# Patient Record
Sex: Male | Born: 1985 | Race: White | Hispanic: No | Marital: Married | Smoking: Former smoker
Health system: Southern US, Community
[De-identification: ages and names within clinical notes are randomized; demographics above are authoritative.]

## PROBLEM LIST (undated history)

## (undated) HISTORY — PX: NO PAST SURGERIES: SHX2092

---

## 2019-01-08 NOTE — Progress Notes (Signed)
Subjective:    Patient ID: [name], male    DOB: 03-Jun-1986, 33 y.o.   MRN: [id number]  HPI  He is here to establish with a new pcp.   The patient is here for an acute visit.   Last Friday and worse over the weekend.  He started getting exhausted.  He also has had tightness in the left side of his chest when walking around, going from a sitting to standing position and taking deep breathes. He denies pain with pressing on his chest.  Sitting or laying down improves the pain.    He has also noticed his HR at rest which is typically in the low 50's is higher in the 65-80 range.  He denies any palpitations, but his HR is more pronounced.      He has not exercised in the past week, but typicaly runs 2-3 times a week and has no difficulty running.    He has not seen any doctor in a long time.  He does not monitor his BP.  His mom and dad both have elevated BP.  He does not eat as good as he should.     Medications and allergies reviewed with patient and updated if appropriate.  Patient Active Problem List   Diagnosis Date Noted  . Elevated blood pressure reading 01/09/2019  . Fatigue 01/09/2019  . Chest tightness 01/09/2019    No current outpatient medications on file prior to visit.   No current facility-administered medications on file prior to visit.     History reviewed. No pertinent past medical history.  History reviewed. No pertinent surgical history.  Social History   Socioeconomic History  . Marital status: Married    Spouse name: Not on file  . Number of children: 1  . Years of education: Not on file  . Highest education level: Not on file  Occupational History  . Not on file  Social Needs  . Financial resource strain: Not on file  . Food insecurity:    Worry: Not on file    Inability: Not on file  . Transportation needs:    Medical: Not on file    Non-medical: Not on file  Tobacco Use  . Smoking status: Never Smoker  . Smokeless tobacco: Never  Used  Substance and Sexual Activity  . Alcohol use: Yes    Comment: daily - 1-2 drinks  . Drug use: Never  . Sexual activity: Yes  Lifestyle  . Physical activity:    Days per week: Not on file    Minutes per session: Not on file  . Stress: Not on file  Relationships  . Social connections:    Talks on phone: Not on file    Gets together: Not on file    Attends religious service: Not on file    Active member of club or organization: Not on file    Attends meetings of clubs or organizations: Not on file    Relationship status: Not on file  Other Topics Concern  . Not on file  Social History Narrative  . Not on file    Family History  Problem Relation Age of Onset  . Hypertension Mother   . Hyperlipidemia Father   . Hypertension Father   . Cancer Maternal Uncle   . Heart failure Maternal Grandmother     Review of Systems  Constitutional: Positive for fatigue (for the past week - better today - after good night sleep). Negative for chills and  fever.  HENT: Positive for rhinorrhea. Negative for congestion, ear pain, postnasal drip, sinus pain and sore throat.   Eyes: Negative for visual disturbance.  Respiratory: Positive for cough (occ cough - likely from allergies). Negative for shortness of breath and wheezing.   Cardiovascular: Positive for chest pain (tightness). Negative for palpitations (felt his HR  - more prominent) and leg swelling.  Gastrointestinal: Negative for abdominal pain, blood in stool, constipation, diarrhea and nausea.       No gerd  Genitourinary: Negative for dysuria, frequency and hematuria.  Musculoskeletal: Negative for arthralgias and back pain.  Neurological: Positive for dizziness (maybe from fatigued). Negative for light-headedness and headaches.  Psychiatric/Behavioral: Negative for dysphoric mood. The patient is not nervous/anxious.        Objective:   Vitals:   01/09/19 0945  BP: (!) 148/94  Pulse: (!) 57  Resp: 16  Temp: 98.8 F  (37.1 C)  SpO2: 99%   BP Readings from Last 3 Encounters:  01/09/19 (!) 148/94   Wt Readings from Last 3 Encounters:  01/09/19 269 lb (122 kg)   Body mass index is 42.13 kg/m.   Physical Exam    Constitutional: He appears well-developed and well-nourished. No distress.  HENT:  Head: Normocephalic and atraumatic.  Right Ear: External ear normal.  Left Ear: External ear normal.  Mouth/Throat: Oropharynx is clear and moist.  Normal ear canals and TM b/l  Eyes: Conjunctivae and EOM are normal.  Neck: Neck supple. No tracheal deviation present. No thyromegaly present. No carotid bruit  Cardiovascular: Normal rate, regular rhythm, normal heart sounds and intact distal pulses.  No murmur heard. Pulmonary/Chest: No chest pain with palpation.  Effort normal and breath sounds normal. No respiratory distress. He has no wheezes. He has no rales.  Abdominal: Soft. He exhibits no distension. There is no tenderness.  Musculoskeletal: He exhibits no edema.  Lymphadenopathy:   He has no cervical adenopathy.  Skin: Skin is warm and dry. He is slightly diaphoretic - likely related to being nervous.  Psychiatric: He has a normal mood and affect. His behavior is normal.       Assessment & Plan:    See Problem List for Assessment and Plan of chronic medical problems.

## 2019-01-09 ENCOUNTER — Encounter: Payer: Self-pay | Admitting: Internal Medicine

## 2019-01-09 ENCOUNTER — Ambulatory Visit: Payer: Managed Care, Other (non HMO) | Admitting: Internal Medicine

## 2019-01-09 ENCOUNTER — Other Ambulatory Visit: Payer: Self-pay

## 2019-01-09 ENCOUNTER — Other Ambulatory Visit (INDEPENDENT_AMBULATORY_CARE_PROVIDER_SITE_OTHER): Payer: Managed Care, Other (non HMO)

## 2019-01-09 ENCOUNTER — Encounter (INDEPENDENT_AMBULATORY_CARE_PROVIDER_SITE_OTHER): Payer: Self-pay

## 2019-01-09 VITALS — BP 148/94 | HR 57 | Temp 98.8°F | Resp 16 | Ht 67.0 in | Wt 269.0 lb

## 2019-01-09 DIAGNOSIS — R030 Elevated blood-pressure reading, without diagnosis of hypertension: Secondary | ICD-10-CM | POA: Diagnosis not present

## 2019-01-09 DIAGNOSIS — R0789 Other chest pain: Secondary | ICD-10-CM

## 2019-01-09 DIAGNOSIS — Z23 Encounter for immunization: Secondary | ICD-10-CM | POA: Diagnosis not present

## 2019-01-09 DIAGNOSIS — R5383 Other fatigue: Secondary | ICD-10-CM | POA: Diagnosis not present

## 2019-01-09 LAB — LIPID PANEL
CHOL/HDL RATIO: 5
Cholesterol: 246 mg/dL — ABNORMAL HIGH (ref 0–200)
HDL: 46.4 mg/dL (ref >39.00)
NonHDL: 199.67
Triglycerides: 203 mg/dL — ABNORMAL HIGH (ref 0.0–149.0)
VLDL: 40.6 mg/dL — AB (ref 0.0–40.0)

## 2019-01-09 LAB — CBC WITH DIFFERENTIAL/PLATELET
Basophils Absolute: 0.1 10*3/uL (ref 0.0–0.1)
Basophils Relative: 1.2 % (ref 0.0–3.0)
Eosinophils Absolute: 0.4 10*3/uL (ref 0.0–0.7)
Eosinophils Relative: 4.3 % (ref 0.0–5.0)
HEMATOCRIT: 47.5 % (ref 39.0–52.0)
Hemoglobin: 16.4 g/dL (ref 13.0–17.0)
LYMPHS PCT: 38.1 % (ref 12.0–46.0)
Lymphs Abs: 3.5 10*3/uL (ref 0.7–4.0)
MCHC: 34.5 g/dL (ref 30.0–36.0)
MCV: 87.9 fl (ref 78.0–100.0)
Monocytes Absolute: 0.7 10*3/uL (ref 0.1–1.0)
Monocytes Relative: 7.7 % (ref 3.0–12.0)
Neutro Abs: 4.5 10*3/uL (ref 1.4–7.7)
Neutrophils Relative %: 48.7 % (ref 43.0–77.0)
Platelets: 252 10*3/uL (ref 150.0–400.0)
RBC: 5.4 Mil/uL (ref 4.22–5.81)
RDW: 13.4 % (ref 11.5–15.5)
WBC: 9.2 10*3/uL (ref 4.0–10.5)

## 2019-01-09 LAB — COMPREHENSIVE METABOLIC PANEL
ALT: 55 U/L — ABNORMAL HIGH (ref 0–53)
AST: 25 U/L (ref 0–37)
Albumin: 4.6 g/dL (ref 3.5–5.2)
Alkaline Phosphatase: 61 U/L (ref 39–117)
BUN: 17 mg/dL (ref 6–23)
CO2: 28 meq/L (ref 19–32)
Calcium: 9.9 mg/dL (ref 8.4–10.5)
Chloride: 102 mEq/L (ref 96–112)
Creatinine, Ser: 1.03 mg/dL (ref 0.40–1.50)
GFR: 83.61 mL/min (ref >60.00)
Glucose, Bld: 99 mg/dL (ref 70–99)
Potassium: 4.4 mEq/L (ref 3.5–5.1)
Sodium: 139 mEq/L (ref 135–145)
Total Bilirubin: 0.6 mg/dL (ref 0.2–1.2)
Total Protein: 8 g/dL (ref 6.0–8.3)

## 2019-01-09 LAB — LDL CHOLESTEROL, DIRECT: Direct LDL: 153 mg/dL

## 2019-01-09 LAB — TSH: TSH: 0.98 u[IU]/mL (ref 0.35–4.50)

## 2019-01-09 NOTE — Assessment & Plan Note (Signed)
For one week Likely related to poor sleep - improved today after getting good sleep last night Will check labs cbc, cmp,tsh Work on good sleep Follow up if not improving

## 2019-01-09 NOTE — Assessment & Plan Note (Addendum)
Left sided chest tightness that sound more MSK in nature - with walking, changing position and deep breaths, not reproducible.  Pain may also be anxiety related, but seem more msk related Will check labs - cbc, tsh, cmp, lipid Discussed ekg and cxr with him and we both agree to hold off for now, given msk in nature and he is low risk BP elevated - may be related to some anxiety, but has strong family history - his wife will start monitoring at home  If chest tightness persists/worsens he will let me know so we can evaluate further

## 2019-01-09 NOTE — Patient Instructions (Addendum)
Monitor your BP at home.  It should be less than 130/80 ideally, definitely less than 140/90.  Continue regular exercise and work on weight loss.    Tests ordered today. Your results will be released to MyChart (or called to you) after review, usually within 72hours after test completion. If any changes need to be made, you will be notified at that same time.  Flu immunization administered today.    Medications reviewed and updated.  Changes include :   none

## 2019-01-09 NOTE — Assessment & Plan Note (Signed)
BP elevated here - ? Related to being nervous or truly high Both parents have htn Start monitoring BP at home - discussed goal Work on weight loss Low sodium diet Regular exercise F/u if BP is elevated Check labs - cmp, cbc, lipid, tsh

## 2019-01-10 ENCOUNTER — Encounter: Payer: Self-pay | Admitting: Internal Medicine

## 2019-01-10 DIAGNOSIS — E785 Hyperlipidemia, unspecified: Secondary | ICD-10-CM | POA: Insufficient documentation

## 2020-07-22 DIAGNOSIS — L0591 Pilonidal cyst without abscess: Secondary | ICD-10-CM

## 2020-07-22 HISTORY — DX: Pilonidal cyst without abscess: L05.91

## 2020-09-21 DIAGNOSIS — U071 COVID-19: Secondary | ICD-10-CM

## 2020-09-21 HISTORY — DX: COVID-19: U07.1

## 2020-11-30 NOTE — Progress Notes (Signed)
Subjective:    CC: Sacral pain  I, Molly Weber, LAT, ATC, am serving as scribe for Dr. Clementeen Graham.  HPI: Pt is a 35 y/o male presenting w/ c/o sacral/LBP since Oct 2021 w/ no known MOI.  He noticed it after driving to [facility] and was in the car for about 9 hours.  He rates his pain at a 5-6/10.  He states that his pain is more nagging in nature.  He locates his pain to his midline proximal sacrum.  No injury history.  Pain is worse with the upright sitting position and better with a reclining sitting position.  No bowel or bladder dysfunction.  No numbness in the penis or anus.  Radiating pain: No Aggravating factors: sitting in an upright position for greater than 30-45 min; transitioning from sitting-to-standing Treatments tried: different types of cushions for his sitting surface;   Pertinent review of Systems: No fevers or chills nausea vomiting or diarrhea.  Relevant historical information: Obesity, hyperlipidemia, fatigue.   Objective:    Vitals:   12/01/20 1042  BP: 140/84  Pulse: 100  SpO2: 98%  Body mass index is 43.73 kg/m.  General: Well Developed, well nourished, and in no acute distress.   MSK: L-spine normal-appearing Nontender midline.  Nontender paraspinal musculature. Normal lumbar motion. Lower extremity strength intact bilaterally. Pulses capillary fill and sensation are intact distally. Reflexes are intact distal bilateral extremities.  Lab and Radiology Results  X-ray images lumbar spine and sacrum obtained today personally and independently interpreted  Lumbar spine: Probable spondylolisthesis at L5-S1 grade 1.  Sacrum and coccyx: No fractures or malalignment.  No significant abnormality sacrum or coccyx.  Await formal radiology review   Impression and Recommendations:    Assessment and Plan: 35 y.o. male with pain at sacrum with upright seated position.  This is ongoing now for months and worsening.  X-ray today is concerning to my read  for spondylolisthesis at L5-S1.  Functionally patient has what sounds like S3 radicular pain with a lumbar extension.  He is a good candidate for physical therapy referral placed.  Lengthy discussion about the importance of attending and participating in physical therapy.  Check back in about 6 weeks.  BMI 43.  Elad also mentioned that his weight may be a factor.  I agree.  Discussed weight management strategies.  Recommend calorie strict diet about 2000 cal/day with emphasis on protein about 100 g of protein per day.  Recommend: Healthy weight and wellness as a referral option.  PDMP not reviewed this encounter. Orders Placed This Encounter  Procedures  . DG Lumbar Spine 2-3 Views    Standing Status:   Future    Number of Occurrences:   1    Standing Expiration Date:   12/01/2021    Order Specific Question:   Reason for Exam (SYMPTOM  OR DIAGNOSIS REQUIRED)    Answer:   eval lower back pain    Order Specific Question:   Preferred imaging location?    Answer:   [facility]  . DG Sacrum/Coccyx    Standing Status:   Future    Number of Occurrences:   1    Standing Expiration Date:   12/01/2021    Order Specific Question:   Reason for Exam (SYMPTOM  OR DIAGNOSIS REQUIRED)    Answer:   eval sacrum pain    Order Specific Question:   Preferred imaging location?    Answer:   [facility]  . Ambulatory referral  to Physical Therapy    Referral Priority:   Routine    Referral Type:   Physical Medicine    Referral Reason:   Specialty Services Required    Requested Specialty:   Physical Therapy    Number of Visits Requested:   1   No orders of the defined types were placed in this encounter.   Discussed warning signs or symptoms. Please see discharge instructions. Patient expresses understanding.   The above documentation has been reviewed and is accurate and complete Clementeen Graham, M.D.

## 2020-12-01 ENCOUNTER — Encounter: Payer: Self-pay | Admitting: Family Medicine

## 2020-12-01 ENCOUNTER — Other Ambulatory Visit: Payer: Self-pay

## 2020-12-01 ENCOUNTER — Ambulatory Visit: Payer: Managed Care, Other (non HMO) | Admitting: Family Medicine

## 2020-12-01 ENCOUNTER — Ambulatory Visit (INDEPENDENT_AMBULATORY_CARE_PROVIDER_SITE_OTHER): Payer: Managed Care, Other (non HMO)

## 2020-12-01 VITALS — BP 140/84 | HR 100 | Ht 67.0 in | Wt 279.2 lb

## 2020-12-01 DIAGNOSIS — M5418 Radiculopathy, sacral and sacrococcygeal region: Secondary | ICD-10-CM

## 2020-12-01 DIAGNOSIS — G8929 Other chronic pain: Secondary | ICD-10-CM

## 2020-12-01 DIAGNOSIS — Z6841 Body Mass Index (BMI) 40.0 and over, adult: Secondary | ICD-10-CM

## 2020-12-01 DIAGNOSIS — M545 Low back pain, unspecified: Secondary | ICD-10-CM

## 2020-12-01 NOTE — Patient Instructions (Addendum)
Thank you for coming in today.  Please get an Xray today before you leave  Please perform the exercise program that we have prepared for you and gone over in detail on a daily basis.  In addition to the handout you were provided you can access your program through: [email or web address]   Your unique program code is:  [email or web address]   I've referred you to Physical Therapy.  Let us know if you don't hear from them in one week.  Recheck in 6 weeks.   Let me know if this is not working.   Sign up for mychart.   Let me know if you want a referral to [facility]. I think its helpful.   TLDNR version is 2000 calories a day 100g protein a day eat breakfast and lunch.

## 2020-12-02 NOTE — Progress Notes (Signed)
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  This is that condition I was telling about where the vertebrae can shift.  The good news is physical therapy is very effective for this condition.  If not better MRI will be next step.  I believe we found the explanation for pain based on the x-ray.

## 2020-12-02 NOTE — Progress Notes (Signed)
X-ray sacrum and coccyx looks normal

## 2021-01-02 ENCOUNTER — Encounter: Payer: Self-pay | Admitting: Family Medicine

## 2021-01-12 ENCOUNTER — Ambulatory Visit: Payer: Managed Care, Other (non HMO) | Admitting: Family Medicine

## 2021-01-12 DIAGNOSIS — M4306 Spondylolysis, lumbar region: Secondary | ICD-10-CM | POA: Insufficient documentation

## 2021-01-12 NOTE — Patient Instructions (Addendum)
You may have a pilonidal cyst.  This may be causing your pain.  A referral was ordered for surgery.     Blood work was ordered.     Medications changes include :   none   Please followup in 1 year     Pilonidal Cyst  A pilonidal cyst is a fluid-filled sac that forms beneath the skin near the tailbone, at the top of the crease of the buttocks (pilonidal area). If the cyst is not large and not infected, it may not cause any problems. If the cyst becomes irritated or infected, it may get larger and fill with pus. An infected cyst is called an abscess. A pilonidal abscess may cause pain and swelling, and it may need to be drained or removed. What are the causes? The cause of this condition is not always known. In some cases, a hair that grows into your skin (ingrown hair) may be the cause. What increases the risk? You are more likely to get a pilonidal cyst if you:  Are male.  Have lots of hair near the crease of the buttocks.  Are overweight.  Have a dimple near the crease of the buttocks.  Wear tight clothing.  Do not bathe or shower often.  Sit for long periods of time. What are the signs or symptoms? Signs and symptoms of a pilonidal cyst may include pain, swelling, redness, and warmth in the pilonidal area. Depending on how big the cyst is, you may be able to feel a lump near your tailbone. If your cyst becomes infected, symptoms may include:  Pus or fluid drainage.  Fever.  Pain, swelling, and redness getting worse.  The lump getting bigger. How is this diagnosed? This condition may be diagnosed based on:  Your symptoms and medical history.  A physical exam.  A blood test to check for infection.  Testing a pus sample, if applicable. How is this treated? If your cyst does not cause symptoms, you may not need any treatment. If your cyst bothers you or is infected, you may need a procedure to drain or remove the cyst. Depending on the size, location, and  severity of your cyst, your health care provider may:  Make an incision in the cyst and drain it (incision and drainage).  Open and drain the cyst, and then stitch the wound so that it stays open while it heals (marsupialization). You will be given instructions about how to care for your open wound while it heals.  Remove all or part of the cyst, and then close the wound (cyst removal). You may need to take antibiotic medicines before your procedure. Follow these instructions at home: Medicines  Take over-the-counter and prescription medicines only as told by your health care provider.  If you were prescribed an antibiotic medicine, take it as told by your health care provider. Do not stop taking the antibiotic even if you start to feel better. General instructions  Keep the area around your pilonidal cyst clean and dry.  If there is fluid or pus draining from your cyst: ? Cover the area with a clean bandage (dressing) as needed. ? Wash the area gently with soap and water. Pat the area dry with a clean towel. Do not rub the area because that may cause bleeding.  Remove hair from the area around the cyst only if your health care provider tells you to do this.  Do not wear tight pants or sit in one position for long periods at  a time.  Keep all follow-up visits as told by your health care provider. This is important. Contact a health care provider if you have:  New redness, swelling, or pain.  A pilonidal cyst is a fluid-filled sac that forms beneath the skin near the tailbone, at the top of the crease of the buttocks (pilonidal area).  If the cyst becomes irritated or infected, it may get larger and fill with pus. An infected cyst is called an abscess.  The cause of this condition is not always known. In some cases, a hair that grows into your skin (ingrown hair) may be the cause.  If your cyst does not cause symptoms, you may not need any treatment. If your cyst bothers you or is  infected, you may need a procedure to drain or remove the cyst. This information is not intended to replace advice given to you by your health care provider. Make sure you discuss any questions you have with your health care provider. Document Revised: 09/26/2017 Document Reviewed: 09/26/2017 Elsevier Patient Education  2021 [employer]     Health Maintenance, Male Adopting a healthy lifestyle and getting preventive care are important in promoting health and wellness. Ask your health care provider about:  The right schedule for you to have regular tests and exams.  Things you can do on your own to prevent diseases and keep yourself healthy. What should I know about diet, weight, and exercise? Eat a healthy diet  Eat a diet that includes plenty of vegetables, fruits, low-fat dairy products, and lean protein.  Do not eat a lot of foods that are high in solid fats, added sugars, or sodium.   Maintain a healthy weight Body mass index (BMI) is a measurement that can be used to identify possible weight problems. It estimates body fat based on height and weight. Your health care provider can help determine your BMI and help you achieve or maintain a healthy weight. Get regular exercise Get regular exercise. This is one of the most important things you can do for your health. Most adults should:  Exercise for at least 150 minutes each week. The exercise should increase your heart rate and make you sweat (moderate-intensity exercise).  Do strengthening exercises at least twice a week. This is in addition to the moderate-intensity exercise.  Spend less time sitting. Even light physical activity can be beneficial. Watch cholesterol and blood lipids Have your blood tested for lipids and cholesterol at 34 years of age, then have this test every 5 years. You may need to have your cholesterol levels checked more often if:  Your lipid or cholesterol levels are high.  You are older than 34  years of age.  You are at high risk for heart disease. What should I know about cancer screening? Many types of cancers can be detected early and may often be prevented. Depending on your health history and family history, you may need to have cancer screening at various ages. This may include screening for:  Colorectal cancer.  Prostate cancer.  Skin cancer.  Lung cancer. What should I know about heart disease, diabetes, and high blood pressure? Blood pressure and heart disease  High blood pressure causes heart disease and increases the risk of stroke. This is more likely to develop in people who have high blood pressure readings, are of African descent, or are overweight.  Talk with your health care provider about your target blood pressure readings.  Have your blood pressure checked: ? Every 3-5  years if you are 64-35 years of age. ? Every year if you are 2 years old or older.  If you are between the ages of 58 and 57 and are a current or former smoker, ask your health care provider if you should have a one-time screening for abdominal aortic aneurysm (AAA). Diabetes Have regular diabetes screenings. This checks your fasting blood sugar level. Have the screening done:  Once every three years after age 40 if you are at a normal weight and have a low risk for diabetes.  More often and at a younger age if you are overweight or have a high risk for diabetes. What should I know about preventing infection? Hepatitis B If you have a higher risk for hepatitis B, you should be screened for this virus. Talk with your health care provider to find out if you are at risk for hepatitis B infection. Hepatitis C Blood testing is recommended for:  Everyone born from [birth year] through 1964.  Anyone with known risk factors for hepatitis C. Sexually transmitted infections (STIs)  You should be screened each year for STIs, including gonorrhea and chlamydia, if: ? You are sexually active and are  younger than 34 years of age. ? You are older than 34 years of age and your health care provider tells you that you are at risk for this type of infection. ? Your sexual activity has changed since you were last screened, and you are at increased risk for chlamydia or gonorrhea. Ask your health care provider if you are at risk.  Ask your health care provider about whether you are at high risk for HIV. Your health care provider may recommend a prescription medicine to help prevent HIV infection. If you choose to take medicine to prevent HIV, you should first get tested for HIV. You should then be tested every 3 months for as long as you are taking the medicine. Follow these instructions at home: Lifestyle  Do not use any products that contain nicotine or tobacco, such as cigarettes, e-cigarettes, and chewing tobacco. If you need help quitting, ask your health care provider.  Do not use street drugs.  Do not share needles.  Ask your health care provider for help if you need support or information about quitting drugs. Alcohol use  Do not drink alcohol if your health care provider tells you not to drink.  If you drink alcohol: ? Limit how much you have to 0-2 drinks a day. ? Be aware of how much alcohol is in your drink. In the [place] one drink equals one 12 oz bottle of beer (355 mL), one 5 oz glass of wine (148 mL), or one 1 oz glass of hard liquor (44 mL). General instructions  Schedule regular health, dental, and eye exams.  Stay current with your vaccines.  Tell your health care provider if: ? You often feel depressed. ? You have ever been abused or do not feel safe at home. Summary  Adopting a healthy lifestyle and getting preventive care are important in promoting health and wellness.  Follow your health care provider's instructions about healthy diet, exercising, and getting tested or screened for diseases.  Follow your health care provider's instructions on monitoring your  cholesterol and blood pressure. This information is not intended to replace advice given to you by your health care provider. Make sure you discuss any questions you have with your health care provider. Document Revised: 10/01/2018 Document Reviewed: 10/01/2018 Elsevier Patient Education  2021 [employer].

## 2021-01-12 NOTE — Progress Notes (Signed)
Subjective:    Patient ID: [name], male    DOB: Nov 07, 1985, 35 y.o.   MRN: [id number]  HPI He is here for a physical exam.   Pars defect - sees Dr Denyse Amass.  Has a standing desk. He is doing PT.  Per his wife - he has been very short, irritable, snippy and not listing / retaining important conversations they have.  He knows he has ben different.  This has been going on for a little over one month.  He works at home so work/home is too integrated.  He had a bad review at work for something that was no his fault - he has been upset by this.    When he was having lower back pain - he was freaking out that he might have had cancer.  He did have an incidental finding on his imaging and that freaked him out. His uncle died from prostate cancer and he got that in his head.   He does not like being in or around large crowds or many people.  This includes grocery store, any store, hiking/zoo, science center, restaurant, driving and just being out in general.   He is not sleeping well, but states that is improving.   Cut/cracking of his skin on his upper buttock.  It bleeds and is very sore.  It started over one month ago.  He has tried vaseline, baby powder, showering and cleaning the are.  He has tried an antifungal cream and burrows solution.     Medications and allergies reviewed with patient and updated if appropriate.  Patient Active Problem List   Diagnosis Date Noted  . 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 BMI 40.0-44.9, adult (HCC) 12/01/2020  . Hyperlipidemia 01/10/2019  . Fatigue 01/09/2019  . Chest tightness 01/09/2019    No current outpatient medications on file prior to visit.   No current facility-administered medications on file prior to visit.    History reviewed. No pertinent past medical history.  History reviewed. No pertinent surgical history.  Social History   Socioeconomic History  . Marital status: Married    Spouse name: Not on file  . Number  of children: 1  . Years of education: Not on file  . Highest education level: Not on file  Occupational History  . Not on file  Tobacco Use  . Smoking status: Never Smoker  . Smokeless tobacco: Never Used  Substance and Sexual Activity  . Alcohol use: Yes    Comment: daily - 1-2 drinks  . Drug use: Never  . Sexual activity: Yes  Other Topics Concern  . Not on file  Social History Narrative  . Not on file   Social Determinants of Health   Financial Resource Strain: Not on file  Food Insecurity: Not on file  Transportation Needs: Not on file  Physical Activity: Not on file  Stress: Not on file  Social Connections: Not on file    Family History  Problem Relation Age of Onset  . Hypertension Mother   . Hyperlipidemia Father   . Hypertension Father   . Cancer Maternal Uncle        ? pancreatic cancer  . Heart failure Maternal Grandmother     Review of Systems  Constitutional: Negative for chills and fever.  Eyes: Negative for visual disturbance.  Respiratory: Negative for cough, shortness of breath and wheezing.   Cardiovascular: Negative for chest pain, palpitations and leg swelling.  Gastrointestinal: Negative for abdominal  pain, blood in stool, constipation, diarrhea and nausea.       No gerd  Genitourinary: Negative for difficulty urinating and dysuria.  Musculoskeletal: Positive for arthralgias (knee and ankles - mild) and back pain.  Skin: Positive for rash (lower back). Negative for color change.  Neurological: Negative for dizziness, light-headedness, numbness and headaches.  Psychiatric/Behavioral: Positive for dysphoric mood and sleep disturbance. The patient is nervous/anxious.        Objective:   Vitals:   01/13/21 1045  BP: 130/78  Pulse: 98  Temp: 98.3 F (36.8 C)  SpO2: 98%   Filed Weights   01/13/21 1045  Weight: 271 lb (122.9 kg)   Body mass index is 42.44 kg/m.  BP Readings from Last 3 Encounters:  01/13/21 130/78  12/01/20 140/84   01/09/19 (!) 148/94    Wt Readings from Last 3 Encounters:  01/13/21 271 lb (122.9 kg)  12/01/20 279 lb 3.2 oz (126.6 kg)  01/09/19 269 lb (122 kg)   Depression screen PHQ 2/9 01/13/2021  Decreased Interest 1  Down, Depressed, Hopeless 1  PHQ - 2 Score 2  Altered sleeping 2  Tired, decreased energy 1  Change in appetite 1  Feeling bad or failure about yourself  0  Trouble concentrating 0  Moving slowly or fidgety/restless 0  Suicidal thoughts 0  PHQ-9 Score 6  Difficult doing work/chores Somewhat difficult    GAD 7 : Generalized Anxiety Score 01/13/2021  Nervous, Anxious, on Edge 1  Control/stop worrying 1  Worry too much - different things 0  Trouble relaxing 0  Restless 0  Easily annoyed or irritable 1  Afraid - awful might happen 0  Total GAD 7 Score 3  Anxiety Difficulty Not difficult at all      Physical Exam Constitutional: He appears well-developed and well-nourished. No distress.  HENT:  Head: Normocephalic and atraumatic.  Right Ear: External ear normal.  Left Ear: External ear normal.  Mouth/Throat: Oropharynx is clear and moist.  Normal ear canals and TM b/l  Eyes: Conjunctivae and EOM are normal.  Neck: Neck supple. No tracheal deviation present. No thyromegaly present.  No carotid bruit  Cardiovascular: Normal rate, regular rhythm, normal heart sounds and intact distal pulses.   No murmur heard. Pulmonary/Chest: Effort normal and breath sounds normal. No respiratory distress. He has no wheezes. He has no rales.  Abdominal: Soft. He exhibits no distension. There is no tenderness.  Genitourinary: deferred  Musculoskeletal: He exhibits no edema.  Lymphadenopathy:   He has no cervical adenopathy.  Skin: Skin is warm and dry. He is not diaphoretic. Pilonidal cyst with slight blood at entry  - no active bleeding, no d/c.  No surrounding erythema Psychiatric: He has a normal mood and affect. His behavior is normal.         Assessment & Plan:    Physical exam: Screening blood work  ordered Immunizations  Declined flu, tdap 2014.   Exercise   Started running again Weight  Advised weight loss Substance abuse   none   Screening for depression and anxiety do indicate mild anxiety and depression.  He feels his symptoms are improving.  He declined any medication at this time, but I did advise him that if he wanted to try something temporarily he can let me know  See Problem List for Assessment and Plan of chronic medical problems.   This visit occurred during the SARS-CoV-2 public health emergency.  Safety protocols were in place, including screening questions prior to  the visit, additional usage of staff PPE, and extensive cleaning of exam room while observing appropriate contact time as indicated for disinfecting solutions.

## 2021-01-13 ENCOUNTER — Encounter: Payer: Self-pay | Admitting: Internal Medicine

## 2021-01-13 ENCOUNTER — Ambulatory Visit (INDEPENDENT_AMBULATORY_CARE_PROVIDER_SITE_OTHER): Payer: Managed Care, Other (non HMO) | Admitting: Internal Medicine

## 2021-01-13 ENCOUNTER — Other Ambulatory Visit: Payer: Self-pay

## 2021-01-13 VITALS — BP 130/78 | HR 98 | Temp 98.3°F | Ht 67.0 in | Wt 271.0 lb

## 2021-01-13 DIAGNOSIS — Z0000 Encounter for general adult medical examination without abnormal findings: Secondary | ICD-10-CM

## 2021-01-13 DIAGNOSIS — E782 Mixed hyperlipidemia: Secondary | ICD-10-CM | POA: Diagnosis not present

## 2021-01-13 DIAGNOSIS — L0591 Pilonidal cyst without abscess: Secondary | ICD-10-CM | POA: Diagnosis not present

## 2021-01-13 DIAGNOSIS — M4306 Spondylolysis, lumbar region: Secondary | ICD-10-CM

## 2021-01-13 DIAGNOSIS — Z6841 Body Mass Index (BMI) 40.0 and over, adult: Secondary | ICD-10-CM | POA: Diagnosis not present

## 2021-01-13 DIAGNOSIS — Z1159 Encounter for screening for other viral diseases: Secondary | ICD-10-CM

## 2021-01-13 DIAGNOSIS — R739 Hyperglycemia, unspecified: Secondary | ICD-10-CM | POA: Diagnosis not present

## 2021-01-13 LAB — CBC WITH DIFFERENTIAL/PLATELET
Basophils Absolute: 0 10*3/uL (ref 0.0–0.1)
Basophils Relative: 0.6 % (ref 0.0–3.0)
Eosinophils Absolute: 0.2 10*3/uL (ref 0.0–0.7)
Eosinophils Relative: 2.7 % (ref 0.0–5.0)
HCT: 45.7 % (ref 39.0–52.0)
Hemoglobin: 15.4 g/dL (ref 13.0–17.0)
Lymphocytes Relative: 29.2 % (ref 12.0–46.0)
Lymphs Abs: 2.3 10*3/uL (ref 0.7–4.0)
MCHC: 33.6 g/dL (ref 30.0–36.0)
MCV: 88.4 fl (ref 78.0–100.0)
Monocytes Absolute: 0.5 10*3/uL (ref 0.1–1.0)
Monocytes Relative: 6.4 % (ref 3.0–12.0)
Neutro Abs: 4.8 10*3/uL (ref 1.4–7.7)
Neutrophils Relative %: 61.1 % (ref 43.0–77.0)
Platelets: 239 10*3/uL (ref 150.0–400.0)
RBC: 5.17 Mil/uL (ref 4.22–5.81)
RDW: 13.6 % (ref 11.5–15.5)
WBC: 7.8 10*3/uL (ref 4.0–10.5)

## 2021-01-13 LAB — COMPREHENSIVE METABOLIC PANEL
ALT: 51 U/L (ref 0–53)
AST: 27 U/L (ref 0–37)
Albumin: 4.8 g/dL (ref 3.5–5.2)
Alkaline Phosphatase: 49 U/L (ref 39–117)
BUN: 20 mg/dL (ref 6–23)
CO2: 28 mEq/L (ref 19–32)
Calcium: 10.2 mg/dL (ref 8.4–10.5)
Chloride: 101 mEq/L (ref 96–112)
Creatinine, Ser: 1.07 mg/dL (ref 0.40–1.50)
GFR: 90.76 mL/min (ref >60.00)
Glucose, Bld: 101 mg/dL — ABNORMAL HIGH (ref 70–99)
Potassium: 3.9 mEq/L (ref 3.5–5.1)
Sodium: 139 mEq/L (ref 135–145)
Total Bilirubin: 0.8 mg/dL (ref 0.2–1.2)
Total Protein: 8 g/dL (ref 6.0–8.3)

## 2021-01-13 LAB — LIPID PANEL
Cholesterol: 209 mg/dL — ABNORMAL HIGH (ref 0–200)
HDL: 41.4 mg/dL (ref >39.00)
LDL Cholesterol: 133 mg/dL — ABNORMAL HIGH (ref 0–99)
NonHDL: 167.4
Total CHOL/HDL Ratio: 5
Triglycerides: 172 mg/dL — ABNORMAL HIGH (ref 0.0–149.0)
VLDL: 34.4 mg/dL (ref 0.0–40.0)

## 2021-01-13 LAB — HEMOGLOBIN A1C: Hgb A1c MFr Bld: 5.4 % (ref 4.6–6.5)

## 2021-01-13 LAB — TSH: TSH: 0.99 u[IU]/mL (ref 0.35–4.50)

## 2021-01-13 NOTE — Assessment & Plan Note (Signed)
Chronic He is now exercising regularly Encouraged weight loss

## 2021-01-13 NOTE — Assessment & Plan Note (Signed)
Sugars have been on the high side A1c

## 2021-01-13 NOTE — Assessment & Plan Note (Signed)
Chronic Lipid panel Continue regular exercise Encouraged weight loss

## 2021-01-13 NOTE — Assessment & Plan Note (Signed)
Acute Looks like he has a pilonidal cyst-no evidence of infection He does have pain with sitting and this could be the cause We will refer to surgery for further confirmation and treatment options

## 2021-01-16 LAB — HEPATITIS C ANTIBODY
Hepatitis C Ab: NONREACTIVE
SIGNAL TO CUT-OFF: 0.01 (ref <1.00)

## 2021-02-14 ENCOUNTER — Ambulatory Visit: Payer: Managed Care, Other (non HMO) | Admitting: Family Medicine

## 2021-02-21 ENCOUNTER — Ambulatory Visit: Payer: Self-pay | Admitting: Surgery

## 2021-02-21 NOTE — H&P (Signed)
History of Present Illness ([name] L. Demetri Kerman MD; 02/21/2021 10:50 AM) The patient is a 35 year old male who presents with a pilonidal cyst.HPI: [name] is a 35 yo male who was referred for evaluation of a pilonidal cyst. In October 2021 he started having pain and discomfort in the sacral area. He has also noticed some drainage intermittently. The pain gets worse with sitting and he is not able to site for longer than about 45 minutes. He has never had an abscess or needed an I&D of the area. Prior to October, he was not having any symptoms.  PMH: none  PSH: none  FHx: HTN (both parents)  Social: Never smoker    Past Surgical History Arbutus Ped, CMA; 02/21/2021 10:31 AM) No pertinent past surgical history   Diagnostic Studies History Arbutus Ped, CMA; 02/21/2021 10:31 AM) Colonoscopy  never  Allergies Arbutus Ped, CMA; 02/21/2021 10:32 AM) No Known Drug Allergies  [02/21/2021]: Allergies Reconciled   Medication History Arbutus Ped, CMA; 02/21/2021 10:32 AM) No Current Medications Medications Reconciled  Social History Arbutus Ped, CMA; 02/21/2021 10:31 AM) Alcohol use  Occasional alcohol use. Caffeine use  Carbonated beverages, Coffee. No drug use  Tobacco use  Former smoker.  Family History Arbutus Ped, CMA; 02/21/2021 10:31 AM) Arthritis  Father, Mother. Breast Cancer  Family Members In General. Cancer  Family Members In General. Cerebrovascular Accident  Family Members In General. Colon Cancer  Family Members In General. Diabetes Mellitus  Family Members In General. Heart Disease  Family Members In General. Heart disease in male family member before age 66  Heart disease in male family member before age 10  Hypertension  Father. Prostate Cancer  Family Members In General. Respiratory Condition  Father. Thyroid problems  Family Members In General, Mother.  Other Problems Arbutus Ped, CMA; 02/21/2021 10:31 AM) Anxiety Disorder  Back Pain   Hypercholesterolemia     Review of Systems Arbutus Ped CMA; 02/21/2021 10:31 AM) General Not Present- Appetite Loss, Chills, Fatigue, Fever, Night Sweats, Weight Gain and Weight Loss. Skin Present- New Lesions. Not Present- Change in Wart/Mole, Dryness, Hives, Jaundice, Non-Healing Wounds, Rash and Ulcer. HEENT Not Present- Earache, Hearing Loss, Hoarseness, Nose Bleed, Oral Ulcers, Ringing in the Ears, Seasonal Allergies, Sinus Pain, Sore Throat, Visual Disturbances, Wears glasses/contact lenses and Yellow Eyes. Respiratory Present- Snoring. Not Present- Bloody sputum, Chronic Cough, Difficulty Breathing and Wheezing. Breast Not Present- Breast Mass, Breast Pain, Nipple Discharge and Skin Changes. Cardiovascular Not Present- Chest Pain, Difficulty Breathing Lying Down, Leg Cramps, Palpitations, Rapid Heart Rate, Shortness of Breath and Swelling of Extremities. Gastrointestinal Not Present- Abdominal Pain, Bloating, Bloody Stool, Change in Bowel Habits, Chronic diarrhea, Constipation, Difficulty Swallowing, Excessive gas, Gets full quickly at meals, Hemorrhoids, Indigestion, Nausea, Rectal Pain and Vomiting. Male Genitourinary Not Present- Blood in Urine, Change in Urinary Stream, Frequency, Impotence, Nocturia, Painful Urination, Urgency and Urine Leakage. Musculoskeletal Present- Back Pain. Not Present- Joint Pain, Joint Stiffness, Muscle Pain, Muscle Weakness and Swelling of Extremities. Neurological Not Present- Decreased Memory, Fainting, Headaches, Numbness, Seizures, Tingling, Tremor, Trouble walking and Weakness. Psychiatric Not Present- Anxiety, Bipolar, Change in Sleep Pattern, Depression, Fearful and Frequent crying. Endocrine Not Present- Cold Intolerance, Excessive Hunger, Hair Changes, Heat Intolerance, Hot flashes and New Diabetes. Hematology Not Present- Blood Thinners, Easy Bruising, Excessive bleeding, Gland problems, HIV and Persistent Infections.  Vitals Arbutus Ped CMA;  02/21/2021 10:32 AM) 02/21/2021 10:32 AM Weight: 262.5 lb Height: 67in Body Surface Area: 2.27 m Body Mass Index: 41.11 kg/m  Temp.: 97.59F  Pulse: 127 (Regular)  P.OX: 98% (Room air) BP: 134/62(Sitting, Left Arm, Standard)       Physical Exam (Ciclaly Mulcahey L. Freida Busman MD; 02/21/2021 10:51 AM) The physical exam findings are as follows: Note: Constitutional: No acute distress; conversant; no deformities Neuro: alert and oriented; cranial nerves grossly in tact; no focal deficits Eyes: Moist conjunctiva; anicteric sclerae; extraocular movements in tact Neck: Trachea midline Lungs: Normal respiratory effort; lungs clear to auscultation bilaterally; symmetric chest wall expansion CV: Regular rate and rhythm; no murmurs; no pitting edema Anorectal: open wound in the superior gluteal cleft at midline, approximately 1cm in length with granulation tissue at the base and mild serous drainage. Several small pits noted inferior to this. MSK: Normal gait and station; no clubbing/cyanosis Psychiatric: Appropriate affect; alert and oriented 3    Assessment & Plan (Rayanna Matusik L. Jajaira Ruis MD; 02/21/2021 10:53 AM) PILONIDAL CYST (L05.91) Story: 35 yo male presenting with sacral pain and drainage, and evidence of a pilonidal sinus on physical exam. We discussed treatment options, including nonoperative, vs excision. 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 I discussed his activities will be restricted for several weeks postoperatively while his wound heals and he expressed understanding. Will schedule outpatient elective pilonidal cyst excision. Patient will be contacted to schedule a surgery date. Education pamphlet on pilonidal disease was provided.  [name], MD [facility], Hepatobiliary and Pancreatic Surgery 02/21/21 11:18 AM

## 2021-04-07 ENCOUNTER — Telehealth: Payer: Self-pay

## 2021-04-09 NOTE — Telephone Encounter (Signed)
This should be done by the surgeon.

## 2021-04-10 ENCOUNTER — Other Ambulatory Visit: Payer: Self-pay

## 2021-04-10 ENCOUNTER — Encounter (HOSPITAL_BASED_OUTPATIENT_CLINIC_OR_DEPARTMENT_OTHER): Payer: Self-pay | Admitting: Surgery

## 2021-04-10 DIAGNOSIS — R030 Elevated blood-pressure reading, without diagnosis of hypertension: Secondary | ICD-10-CM

## 2021-04-10 HISTORY — DX: Elevated blood-pressure reading, without diagnosis of hypertension: R03.0

## 2021-04-10 NOTE — Progress Notes (Signed)
Spoke w/ via phone for pre-op interview---pt Lab needs dos---- none              Lab results------none COVID test -----patient states asymptomatic no test needed Arrive at -------700 am 04-13-2021 NPO after MN NO Solid Food.  Clear liquids from MN until--- Med rec completed Medications to take morning of surgery -----600 am then npo Diabetic medication ----- Patient instructed no nail polish to be worn day of surgery Patient instructed to bring photo id and insurance card day of surgery Patient aware to have Driver (ride ) / caregiver   wife Luanna Cole will drop pt off  for 24 hours after surgery  Patient Special Instructions -----none Pre-Op special Istructions -----none Patient verbalized understanding of instructions that were given at this phone interview. Patient denies shortness of breath, chest pain, fever, cough at this phone interview.

## 2021-04-12 ENCOUNTER — Encounter (HOSPITAL_BASED_OUTPATIENT_CLINIC_OR_DEPARTMENT_OTHER): Payer: Self-pay | Admitting: Surgery

## 2021-04-12 NOTE — Anesthesia Preprocedure Evaluation (Addendum)
Anesthesia Evaluation  Patient identified by MRN, date of birth, ID band Patient awake    Reviewed: Allergy & Precautions, NPO status , Patient's Chart, lab work & pertinent test results  Airway Mallampati: I  TM Distance: >3 FB Neck ROM: Full    Dental no notable dental hx. (+) Teeth Intact, Dental Advisory Given   Pulmonary former smoker,  Hx/o Covid 09/2020- recovered  Snores- possible undiagnosed OSA   Pulmonary exam normal breath sounds clear to auscultation       Cardiovascular negative cardio ROS Normal cardiovascular exam Rhythm:Regular Rate:Normal     Neuro/Psych negative neurological ROS  negative psych ROS   GI/Hepatic negative GI ROS, Neg liver ROS,   Endo/Other  Morbid obesityHyperlipidemia  Renal/GU negative Renal ROS  negative genitourinary   Musculoskeletal Pilonidal cyst   Abdominal (+) + obese,   Peds  Hematology negative hematology ROS (+)   Anesthesia Other Findings   Reproductive/Obstetrics                           Anesthesia Physical Anesthesia Plan  ASA: 3  Anesthesia Plan: General   Post-op Pain Management:    Induction: Intravenous  PONV Risk Score and Plan: 3 and Treatment may vary due to age or medical condition, Midazolam, Scopolamine patch - Pre-op, Dexamethasone and Ondansetron  Airway Management Planned: Oral ETT  Additional Equipment:   Intra-op Plan:   Post-operative Plan: Extubation in OR  Informed Consent: I have reviewed the patients History and Physical, chart, labs and discussed the procedure including the risks, benefits and alternatives for the proposed anesthesia with the patient or authorized representative who has indicated his/her understanding and acceptance.     Dental advisory given  Plan Discussed with: CRNA and Anesthesiologist  Anesthesia Plan Comments:        Anesthesia Quick Evaluation

## 2021-04-12 NOTE — Progress Notes (Signed)
Pt aware case time moved to 0730, new arrival time 0530 confirmed.

## 2021-04-13 ENCOUNTER — Ambulatory Visit (HOSPITAL_BASED_OUTPATIENT_CLINIC_OR_DEPARTMENT_OTHER): Payer: Managed Care, Other (non HMO) | Admitting: Anesthesiology

## 2021-04-13 ENCOUNTER — Ambulatory Visit (HOSPITAL_BASED_OUTPATIENT_CLINIC_OR_DEPARTMENT_OTHER)
Admission: RE | Admit: 2021-04-13 | Discharge: 2021-04-13 | Disposition: A | Discharge status: Home / Self Care | Payer: Managed Care, Other (non HMO) | Attending: Surgery | Admitting: Surgery

## 2021-04-13 ENCOUNTER — Encounter (HOSPITAL_BASED_OUTPATIENT_CLINIC_OR_DEPARTMENT_OTHER)
Admission: RE | Disposition: A | Discharge status: Home / Self Care | Payer: Self-pay | Source: Home / Self Care | Attending: Surgery

## 2021-04-13 ENCOUNTER — Other Ambulatory Visit: Payer: Self-pay

## 2021-04-13 ENCOUNTER — Telehealth: Payer: Self-pay | Admitting: Internal Medicine

## 2021-04-13 ENCOUNTER — Encounter (HOSPITAL_BASED_OUTPATIENT_CLINIC_OR_DEPARTMENT_OTHER): Payer: Self-pay | Admitting: Surgery

## 2021-04-13 DIAGNOSIS — L0591 Pilonidal cyst without abscess: Secondary | ICD-10-CM | POA: Diagnosis present

## 2021-04-13 DIAGNOSIS — Z8616 Personal history of COVID-19: Secondary | ICD-10-CM | POA: Diagnosis not present

## 2021-04-13 DIAGNOSIS — Z87891 Personal history of nicotine dependence: Secondary | ICD-10-CM | POA: Diagnosis not present

## 2021-04-13 HISTORY — PX: PILONIDAL CYST EXCISION: SHX744

## 2021-04-13 SURGERY — EXCISION, SIMPLE PILONIDAL CYST
Anesthesia: General | Site: Coccyx

## 2021-04-13 MED ORDER — SUGAMMADEX SODIUM 200 MG/2ML IV SOLN
INTRAVENOUS | Status: DC | PRN
  Administered 2021-04-13: 200 mg via INTRAVENOUS

## 2021-04-13 MED ORDER — MIDAZOLAM HCL 2 MG/2ML IJ SOLN
INTRAMUSCULAR | Status: AC
  Filled 2021-04-13: qty 2

## 2021-04-13 MED ORDER — BUPIVACAINE LIPOSOME 1.3 % IJ SUSP
INTRAMUSCULAR | Status: DC | PRN
  Administered 2021-04-13: 8.5 mL

## 2021-04-13 MED ORDER — DEXAMETHASONE SODIUM PHOSPHATE 10 MG/ML IJ SOLN
INTRAMUSCULAR | Status: AC
  Filled 2021-04-13: qty 1

## 2021-04-13 MED ORDER — ONDANSETRON HCL 4 MG/2ML IJ SOLN: 4.0000 mg | Freq: Once | INTRAMUSCULAR | Status: DC | PRN

## 2021-04-13 MED ORDER — LACTATED RINGERS IV SOLN: INTRAVENOUS | Status: DC

## 2021-04-13 MED ORDER — GLYCOPYRROLATE PF 0.2 MG/ML IJ SOSY
PREFILLED_SYRINGE | INTRAMUSCULAR | Status: DC | PRN
  Administered 2021-04-13: .2 mg via INTRAVENOUS

## 2021-04-13 MED ORDER — GABAPENTIN 300 MG PO CAPS
300.0000 mg | ORAL_CAPSULE | ORAL | Status: AC
  Administered 2021-04-13: 300 mg via ORAL

## 2021-04-13 MED ORDER — CEFAZOLIN IN SODIUM CHLORIDE 3-0.9 GM/100ML-% IV SOLN
3.0000 g | INTRAVENOUS | Status: AC
  Administered 2021-04-13: 3 g via INTRAVENOUS

## 2021-04-13 MED ORDER — ARTIFICIAL TEARS OPHTHALMIC OINT
TOPICAL_OINTMENT | OPHTHALMIC | Status: AC
  Filled 2021-04-13: qty 3.5

## 2021-04-13 MED ORDER — LIDOCAINE HCL (PF) 2 % IJ SOLN
INTRAMUSCULAR | Status: AC
  Filled 2021-04-13: qty 5

## 2021-04-13 MED ORDER — PROPOFOL 10 MG/ML IV BOLUS
INTRAVENOUS | Status: AC
  Filled 2021-04-13: qty 40

## 2021-04-13 MED ORDER — LIDOCAINE 2% (20 MG/ML) 5 ML SYRINGE
INTRAMUSCULAR | Status: DC | PRN
  Administered 2021-04-13: 100 mg via INTRAVENOUS

## 2021-04-13 MED ORDER — PHENYLEPHRINE 40 MCG/ML (10ML) SYRINGE FOR IV PUSH (FOR BLOOD PRESSURE SUPPORT)
PREFILLED_SYRINGE | INTRAVENOUS | Status: DC | PRN
Start: 2021-04-13 — End: 2021-04-13
  Administered 2021-04-13 (×2): 80 ug via INTRAVENOUS

## 2021-04-13 MED ORDER — DEXAMETHASONE SODIUM PHOSPHATE 10 MG/ML IJ SOLN
INTRAMUSCULAR | Status: DC | PRN
  Administered 2021-04-13: 10 mg via INTRAVENOUS

## 2021-04-13 MED ORDER — OXYCODONE HCL 5 MG PO TABS
5.0000 mg | ORAL_TABLET | Freq: Once | ORAL | Status: AC | PRN
  Administered 2021-04-13: 5 mg via ORAL

## 2021-04-13 MED ORDER — BUPIVACAINE-EPINEPHRINE 0.5% -1:200000 IJ SOLN
INTRAMUSCULAR | Status: DC | PRN
  Administered 2021-04-13: 21.5 mL

## 2021-04-13 MED ORDER — HYDROCODONE-ACETAMINOPHEN 5-325 MG PO TABS: 1.0000 | ORAL_TABLET | Freq: Four times a day (QID) | ORAL | 0 refills | Status: DC | PRN

## 2021-04-13 MED ORDER — GABAPENTIN 300 MG PO CAPS
ORAL_CAPSULE | ORAL | Status: AC
  Filled 2021-04-13: qty 1

## 2021-04-13 MED ORDER — PROPOFOL 10 MG/ML IV BOLUS
INTRAVENOUS | Status: DC | PRN
  Administered 2021-04-13: 200 mg via INTRAVENOUS

## 2021-04-13 MED ORDER — ONDANSETRON HCL 4 MG/2ML IJ SOLN
INTRAMUSCULAR | Status: DC | PRN
  Administered 2021-04-13: 4 mg via INTRAVENOUS

## 2021-04-13 MED ORDER — ROCURONIUM BROMIDE 10 MG/ML (PF) SYRINGE
PREFILLED_SYRINGE | INTRAVENOUS | Status: AC
  Filled 2021-04-13: qty 10

## 2021-04-13 MED ORDER — PHENYLEPHRINE 40 MCG/ML (10ML) SYRINGE FOR IV PUSH (FOR BLOOD PRESSURE SUPPORT)
PREFILLED_SYRINGE | INTRAVENOUS | Status: AC
  Filled 2021-04-13: qty 10

## 2021-04-13 MED ORDER — ROCURONIUM BROMIDE 10 MG/ML (PF) SYRINGE
PREFILLED_SYRINGE | INTRAVENOUS | Status: DC | PRN
  Administered 2021-04-13: 60 mg via INTRAVENOUS

## 2021-04-13 MED ORDER — MIDAZOLAM HCL 2 MG/2ML IJ SOLN
INTRAMUSCULAR | Status: DC | PRN
  Administered 2021-04-13: 2 mg via INTRAVENOUS

## 2021-04-13 MED ORDER — FENTANYL CITRATE (PF) 100 MCG/2ML IJ SOLN: 25.0000 ug | INTRAMUSCULAR | Status: DC | PRN

## 2021-04-13 MED ORDER — FENTANYL CITRATE (PF) 100 MCG/2ML IJ SOLN
INTRAMUSCULAR | Status: AC
  Filled 2021-04-13: qty 2

## 2021-04-13 MED ORDER — OXYCODONE HCL 5 MG/5ML PO SOLN: 5.0000 mg | Freq: Once | ORAL | Status: AC | PRN

## 2021-04-13 MED ORDER — ACETAMINOPHEN 500 MG PO TABS
ORAL_TABLET | ORAL | Status: AC
  Filled 2021-04-13: qty 1

## 2021-04-13 MED ORDER — CEFAZOLIN IN SODIUM CHLORIDE 3-0.9 GM/100ML-% IV SOLN
INTRAVENOUS | Status: AC
  Filled 2021-04-13: qty 100

## 2021-04-13 MED ORDER — ACETAMINOPHEN 500 MG PO TABS
1000.0000 mg | ORAL_TABLET | ORAL | Status: AC
  Administered 2021-04-13: 1000 mg via ORAL

## 2021-04-13 MED ORDER — ACETAMINOPHEN 500 MG PO TABS
ORAL_TABLET | ORAL | Status: AC
  Filled 2021-04-13: qty 2

## 2021-04-13 MED ORDER — ONDANSETRON HCL 4 MG/2ML IJ SOLN
INTRAMUSCULAR | Status: AC
  Filled 2021-04-13: qty 2

## 2021-04-13 MED ORDER — FENTANYL CITRATE (PF) 100 MCG/2ML IJ SOLN
INTRAMUSCULAR | Status: DC | PRN
  Administered 2021-04-13: 100 ug via INTRAVENOUS

## 2021-04-13 MED ORDER — OXYCODONE HCL 5 MG PO TABS
ORAL_TABLET | ORAL | Status: AC
  Filled 2021-04-13: qty 1

## 2021-04-13 SURGICAL SUPPLY — 45 items
APL SKNCLS STERI-STRIP NONHPOA (GAUZE/BANDAGES/DRESSINGS) ×1
BENZOIN TINCTURE PRP APPL 2/3 (GAUZE/BANDAGES/DRESSINGS) ×2 IMPLANT
BLADE EXTENDED COATED 6.5IN (ELECTRODE) IMPLANT
BLADE HEX COATED 2.75 (ELECTRODE) IMPLANT
BLADE SURG 10 STRL SS (BLADE) IMPLANT
BLADE SURG 15 STRL LF DISP TIS (BLADE) ×1 IMPLANT
BLADE SURG 15 STRL SS (BLADE) ×2
BRIEF STRETCH FOR OB PAD LRG (UNDERPADS AND DIAPERS) IMPLANT
COVER BACK TABLE 60X90IN (DRAPES) ×2 IMPLANT
COVER MAYO STAND STRL (DRAPES) ×2 IMPLANT
COVER WAND RF STERILE (DRAPES) ×2 IMPLANT
DRAIN PENROSE 0.25X18 (DRAIN) IMPLANT
DRAPE LAPAROTOMY 100X72 PEDS (DRAPES) ×2 IMPLANT
DRAPE UTILITY XL STRL (DRAPES) ×2 IMPLANT
DRSG PAD ABDOMINAL 8X10 ST (GAUZE/BANDAGES/DRESSINGS) ×2 IMPLANT
ELECT BLADE TIP CTD 4 INCH (ELECTRODE) IMPLANT
ELECT NEEDLE BLADE 2-5/6 (NEEDLE) ×2 IMPLANT
ELECT REM PT RETURN 9FT ADLT (ELECTROSURGICAL) ×2
ELECTRODE REM PT RTRN 9FT ADLT (ELECTROSURGICAL) ×1 IMPLANT
GAUZE SPONGE 4X4 12PLY STRL (GAUZE/BANDAGES/DRESSINGS) ×2 IMPLANT
GAUZE SPONGE 4X4 12PLY STRL LF (GAUZE/BANDAGES/DRESSINGS) ×2 IMPLANT
GAUZE VASELINE 3X9 (GAUZE/BANDAGES/DRESSINGS) IMPLANT
GLOVE SURG POLY MICRO LF SZ5.5 (GLOVE) ×2 IMPLANT
GLOVE SURG UNDER POLY LF SZ6 (GLOVE) ×2 IMPLANT
GOWN STRL REUS W/TWL LRG LVL3 (GOWN DISPOSABLE) ×2 IMPLANT
KIT TURNOVER CYSTO (KITS) ×2 IMPLANT
NDL SAFETY ECLIPSE 18X1.5 (NEEDLE) IMPLANT
NEEDLE HYPO 18GX1.5 SHARP (NEEDLE)
NEEDLE HYPO 25X1 1.5 SAFETY (NEEDLE) ×2 IMPLANT
NS IRRIG 500ML POUR BTL (IV SOLUTION) ×2 IMPLANT
PACK BASIN DAY SURGERY FS (CUSTOM PROCEDURE TRAY) ×2 IMPLANT
PAD ARMBOARD 7.5X6 YLW CONV (MISCELLANEOUS) IMPLANT
PENCIL SMOKE EVACUATOR (MISCELLANEOUS) ×2 IMPLANT
SPONGE SURGIFOAM ABS GEL 12-7 (HEMOSTASIS) IMPLANT
SUT CHROMIC 3 0 PS 2 (SUTURE) IMPLANT
SUT CHROMIC 3 0 SH 27 (SUTURE) ×2 IMPLANT
SUT ETHILON 3 0 PS 1 (SUTURE) ×4 IMPLANT
SUT VIC AB 2-0 SH 27 (SUTURE)
SUT VIC AB 2-0 SH 27XBRD (SUTURE) IMPLANT
SUT VIC AB 3-0 SH 18 (SUTURE) ×4 IMPLANT
SYR CONTROL 10ML LL (SYRINGE) ×2 IMPLANT
TOWEL OR 17X26 10 PK STRL BLUE (TOWEL DISPOSABLE) ×2 IMPLANT
TRAY DSU PREP LF (CUSTOM PROCEDURE TRAY) ×2 IMPLANT
TUBE CONNECTING 12X1/4 (SUCTIONS) ×2 IMPLANT
YANKAUER SUCT BULB TIP NO VENT (SUCTIONS) ×2 IMPLANT

## 2021-04-13 NOTE — Transfer of Care (Signed)
Immediate Anesthesia Transfer of Care Note  Patient: [name]  Procedure(s) Performed: Procedure(s) (LRB): PILONIDAL CYST EXCISION (N/A)  Patient Location: PACU  Anesthesia Type: General  Level of Consciousness: awake, alert  and oriented  Airway & Oxygen Therapy: Patient Spontanous Breathing and Patient connected to nasal cannula oxygen  Post-op Assessment: Report given to PACU RN and Post -op Vital signs reviewed and stable  Post vital signs: Reviewed and stable  Complications: No apparent anesthesia complications  Last Vitals:  Vitals Value Taken Time  BP 133/74 04/13/21 0843  Temp    Pulse 108 04/13/21 0845  Resp 15 04/13/21 0845  SpO2 94 % 04/13/21 0845  Vitals shown include unvalidated device data.  Last Pain:  Vitals:   04/13/21 0611  TempSrc: Oral  PainSc: 1          Complications: No notable events documented.

## 2021-04-13 NOTE — Op Note (Signed)
Date: 04/13/21  Patient: [name] MRN: [id number]  Preoperative Diagnosis: Pilonidal cyst Postoperative Diagnosis: Same  Procedure: Excision of pilonidal cyst  Surgeon: [name], MD  EBL: Minimal  Anesthesia: General  Specimens: Pilonidal cyst  Indications: [name] is a 35 yo male who presented with a pilonidal cyst with frequent pain and drainage. He elected to proceed with surgical excision.  Findings: Epithelialized sinus tract in the superior gluteal cleft, consistent with a pilonidal cyst. No signs of infection.  Procedure details: Informed consent was obtained in the preoperative area prior to the procedure. The patient was brought to the operating room and general anesthesia was induced. The patient was placed in the prone position. Perioperative antibiotics were administered per SCIP guidelines. The patient was prepped and draped in the usual sterile fashion. A pre-procedure timeout was taken verifying patient identity, surgical site and procedure to be performed.  There an epithelialized sinus tract in the superior gluteal cleft that was about 2cm in length, with several small pits noted inferior to this at the midline. An elliptical skin incision was made to include the large sinus and all visible pits. The subcutaneous tissue was then divided down to the sacral fascia using cautery. The specimen was taken off the fascia using cautery and sent for pathology. The wound was examined and there were no visible sinus tracts or cyst remnants. The wound was irrigated and hemostasis was achieved with cautery. A subcutaneous flap was raised on the right side of the incision to allow the skin to be closed just to the left of midline. The subcutaneous tissue was closed in 2 layers with interrupted 3-0 Vicryl sutures. The skin was closed with 2-0 nylon horizontal mattress sutures. A clean dressing was applied.  The patient tolerated the procedure with no apparent complications.  All counts were correct x2 at the end of the procedure. The patient was extubated and taken to PACU in stable condition.  [name], MD 04/13/21 8:48 AM

## 2021-04-13 NOTE — Telephone Encounter (Signed)
Type of form received: FMLA  Additional comments:   Received by: [place]  Form should be Faxed to: [phone]  Form should be mailed to:  N/A  Is patient requesting call for pickup: N/A   Form placed in the Provider's box.  *Attach charge sheet.  Provider will determine charge.*  Was patient informed of  7-10 business day turn around (Y/N)? N

## 2021-04-13 NOTE — Anesthesia Procedure Notes (Signed)
Procedure Name: Intubation Date/Time: 04/13/2021 7:40 AM Performed by: Mechele Claude, CRNA Pre-anesthesia Checklist: Patient identified, Emergency Drugs available, Suction available and Patient being monitored Patient Re-evaluated:Patient Re-evaluated prior to induction Oxygen Delivery Method: Circle system utilized Preoxygenation: Pre-oxygenation with 100% oxygen Induction Type: IV induction Ventilation: Mask ventilation without difficulty Laryngoscope Size: Mac and 4 Grade View: Grade II Tube type: Oral Tube size: 7.5 mm Number of attempts: 1 Airway Equipment and Method: Stylet and Oral airway Placement Confirmation: ETT inserted through vocal cords under direct vision, positive ETCO2 and breath sounds checked- equal and bilateral Secured at: 21 cm Tube secured with: Tape Dental Injury: Teeth and Oropharynx as per pre-operative assessment

## 2021-04-13 NOTE — H&P (Signed)
[name] is an 35 y.o. male.   Chief Complaint: pilonidal cyst HPI: [name] is a 35 yo male who was referred for evaluation of a pilonidal cyst. In October 2021 he started having pain and discomfort in the sacral area. He has also noticed some drainage intermittently. The pain gets worse with sitting and he is not able to site for longer than about 45 minutes. He has never had an abscess or needed an I&D of the area. Prior to October, he was not having any symptoms.  Past Medical History:  Diagnosis Date   COVID 09/2020   sob cough fatigue loss of taste/wmell x 3 weeks all symptoms resolved   Elevated blood pressure reading 04/10/2021   Pilonidal cyst 07/2020   drains occ with bleeding uses soap and water    Past Surgical History:  Procedure Laterality Date   NO PAST SURGERIES      Family History  Problem Relation Age of Onset   Hypertension Mother    Hyperlipidemia Father    Hypertension Father    Cancer Maternal Uncle        ? pancreatic cancer   Heart failure Maternal Grandmother    Social History:  reports that he quit smoking about 10 years ago. His smoking use included cigarettes. He has a 1.00 pack-year smoking history. He has never used smokeless tobacco. He reports current alcohol use. He reports that he does not use drugs.  Allergies: No Known Allergies  No medications prior to admission.    No results found for this or any previous visit (from the past 48 hour(s)). No results found.  Review of Systems  Constitutional:  Negative for chills and fever.  Eyes:  Negative for visual disturbance.  Respiratory:  Negative for wheezing and stridor.   Cardiovascular:  Negative for chest pain.  Gastrointestinal:  Negative for abdominal pain.  Neurological:  Negative for facial asymmetry and speech difficulty.  Psychiatric/Behavioral:  Negative for agitation and confusion.    Blood pressure (!) 142/90, pulse 77, temperature 98.7 F (37.1 C), temperature source  Oral, resp. rate 12, height 5\' 7"  (1.702 m), weight 117.7 kg, SpO2 98 %. Physical Exam Constitutional:      General: He is not in acute distress.    Appearance: Normal appearance.  HENT:     Head: Normocephalic and atraumatic.  Eyes:     General: No scleral icterus.    Extraocular Movements: Extraocular movements intact.  Pulmonary:     Effort: Pulmonary effort is normal. No respiratory distress.  Musculoskeletal:        General: No deformity. Normal range of motion.     Cervical back: Normal range of motion.  Skin:    General: Skin is warm and dry.     Coloration: Skin is not jaundiced.  Neurological:     General: No focal deficit present.     Mental Status: He is alert and oriented to person, place, and time.  Psychiatric:        Mood and Affect: Mood normal.        Behavior: Behavior normal.        Thought Content: Thought content normal.     Assessment/Plan 35 yo male with pilonidal cyst with significant symptoms. Proceed to OR for excision. Plan for discharge home from PACU. Postop activity restrictions discussed. Informed consent obtained.  20, MD 04/13/2021, 7:26 AM

## 2021-04-13 NOTE — Discharge Instructions (Addendum)
[facility] DISCHARGE INSTRUCTIONS  Activity You may sit, but do not bend over at the waist more than 90 degrees - this is to prevent unnecessary tension on your incision. Ok to shower in 24 hours after surgery, but do not bathe or submerge incisions underwater. Do not drive while taking narcotic pain medication.  Wound Care There are sutures in your skin - these will be removed when you come to clinic. You may shower and allow warm soapy water to run over your incision. Gently pat dry. Do not submerge your incision underwater. It is important to keep your incision as clean and dry as possible. Monitor your incision for any new redness, tenderness, or drainage. A small amount of clear or reddish drainage is normal.  When to Call us: Fever greater than 100.5 New redness, drainage, or swelling at incision site Severe pain, nausea, or vomiting   Follow-up You have an appointment scheduled with Dr. Freida Busman on April 28, 2021 at 2:00pm. This will be at the [facility] Surgery office at [address], [address], [place], [place]. Please arrive at least 15 minutes prior to your scheduled appointment time.  For questions or concerns, please call the office at [phone].    Post Anesthesia Home Care Instructions  Activity: Get plenty of rest for the remainder of the day. A responsible individual must stay with you for 24 hours following the procedure.  For the next 24 hours, DO NOT: -Drive a car -Advertising copywriter -Drink alcoholic beverages -Take any medication unless instructed by your physician -Make any legal decisions or sign important papers.  Meals: Start with liquid foods such as gelatin or soup. Progress to regular foods as tolerated. Avoid greasy, spicy, heavy foods. If nausea and/or vomiting occur, drink only clear liquids until the nausea and/or vomiting subsides. Call your physician if vomiting continues.  Special Instructions/Symptoms: Your throat  may feel dry or sore from the anesthesia or the breathing tube placed in your throat during surgery. If this causes discomfort, gargle with warm salt water. The discomfort should disappear within 24 hours.  If you had a scopolamine patch placed behind your ear for the management of post- operative nausea and/or vomiting:  1. The medication in the patch is effective for 72 hours, after which it should be removed.  Wrap patch in a tissue and discard in the trash. Wash hands thoroughly with soap and water. 2. You may remove the patch earlier than 72 hours if you experience unpleasant side effects which may include dry mouth, dizziness or visual disturbances. 3. Avoid touching the patch. Wash your hands with soap and water after contact with the patch.

## 2021-04-13 NOTE — Anesthesia Postprocedure Evaluation (Signed)
Anesthesia Post Note  Patient: [name]  Procedure(s) Performed: PILONIDAL CYST EXCISION (Coccyx)     Patient location during evaluation: PACU Anesthesia Type: General Level of consciousness: awake and alert and oriented Pain management: pain level controlled Vital Signs Assessment: post-procedure vital signs reviewed and stable Respiratory status: spontaneous breathing, nonlabored ventilation and respiratory function stable Cardiovascular status: blood pressure returned to baseline and stable Postop Assessment: no apparent nausea or vomiting Anesthetic complications: no   No notable events documented.  Last Vitals:  Vitals:   04/13/21 0915 04/13/21 0940  BP: (!) 117/56 131/77  Pulse: 74 85  Resp: 18 16  Temp:  36.6 C  SpO2: 94% 94%    Last Pain:  Vitals:   04/13/21 0940  TempSrc:   PainSc: 4                  Tavi Hoogendoorn A.

## 2021-04-14 ENCOUNTER — Encounter (HOSPITAL_BASED_OUTPATIENT_CLINIC_OR_DEPARTMENT_OTHER): Payer: Self-pay | Admitting: Surgery

## 2021-04-14 LAB — SURGICAL PATHOLOGY

## 2021-06-01 NOTE — Progress Notes (Signed)
Subjective:    Patient ID: [name], male    DOB: 1986/05/06, 34 y.o.   MRN: [id number]  HPI The patient is here for an acute visit.  He had surgery for his pilonidal cyst - he is in more pain now than prior to having surgery.  He still has drainage and bleeding.  This is a constant thing.  He has tried to contact them but has not heard from the.  He is not sure what he needs to do at this point.  He does take tramadol as needed for the pain.   Bump on left forearm - noticed it a couple of months ago.  Denies trauma.  No pain.  It is slightly red.   Medications and allergies reviewed with patient and updated if appropriate.  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     No current facility-administered medications on file prior to visit.    Past Medical History:  Diagnosis Date   COVID 09/2020   sob cough fatigue loss of taste/wmell x 3 weeks all symptoms resolved   Elevated blood pressure reading 04/10/2021   Pilonidal cyst 07/2020   drains occ with bleeding uses soap and water    Past Surgical History:  Procedure Laterality Date   NO PAST SURGERIES     PILONIDAL CYST EXCISION N/A 04/13/2021   Procedure: PILONIDAL CYST EXCISION;  Surgeon: Fritzi Mandes, MD;  Location: [facility];  Service: General;  Laterality: N/A;    Social History   Socioeconomic History   Marital status: Married    Spouse name: Not on file   Number of children: 1   Years of education: Not on file   Highest education level: Not on file  Occupational History   Not on file  Tobacco Use   Smoking status: Former    Packs/day:  0.50    Years: 2.00    Pack years: 1.00    Types: Cigarettes    Quit date: 04/11/2011    Years since quitting: 10.1   Smokeless tobacco: Never  Vaping Use   Vaping Use: Never used  Substance and Sexual Activity   Alcohol use: Yes    Comment: occ   Drug use: Never   Sexual activity: Yes  Other Topics Concern   Not on file  Social History Narrative   Not on file   Social Determinants of Health   Financial Resource Strain: Not on file  Food Insecurity: Not on file  Transportation Needs: Not on file  Physical Activity: Not on file  Stress: Not on file  Social Connections: Not on file    Family History  Problem Relation Age of Onset   Hypertension Mother    Hyperlipidemia Father    Hypertension Father    Cancer Maternal Uncle        ? pancreatic cancer   Heart failure Maternal Grandmother     Review of Systems  Constitutional:  Negative for chills and fever.  Musculoskeletal:  Positive for back pain.  Skin:  Positive for color change (Left forearm) and wound (drainage with pus and blood).  Objective:   Vitals:   06/02/21 1005  BP: 130/82  Pulse: 87  Temp: 98 F (36.7 C)  SpO2: 97%   BP Readings from Last 3 Encounters:  06/02/21 130/82  04/13/21 131/77  01/13/21 130/78   Wt Readings from Last 3 Encounters:  06/02/21 264 lb (119.7 kg)  04/13/21 259 lb 6.4 oz (117.7 kg)  01/13/21 271 lb (122.9 kg)   Body mass index is 41.35 kg/m.   Physical Exam Constitutional:      General: He is not in acute distress.    Appearance: Normal appearance. He is not ill-appearing.  HENT:     Head: Normocephalic.  Skin:    General: Skin is warm and dry.     Findings: Lesion (Papule left distal posterior forearm that is nontender-looks like an infected hair follicle, but nontender so that is unlikely-benign-appearing) present.     Comments: He deferred an exam of the pilonidal cyst region-he did show me some pictures  Neurological:     Mental Status: He is alert.            Assessment & Plan:    See Problem List for Assessment and Plan of chronic medical problems.    This visit occurred during the SARS-CoV-2 public health emergency.  Safety protocols were in place, including screening questions prior to the visit, additional usage of staff PPE, and extensive cleaning of exam room while observing appropriate contact time as indicated for disinfecting solutions.

## 2021-06-02 ENCOUNTER — Encounter: Payer: Self-pay | Admitting: Internal Medicine

## 2021-06-02 ENCOUNTER — Other Ambulatory Visit: Payer: Self-pay

## 2021-06-02 ENCOUNTER — Ambulatory Visit: Payer: Managed Care, Other (non HMO) | Admitting: Internal Medicine

## 2021-06-02 DIAGNOSIS — R238 Other skin changes: Secondary | ICD-10-CM

## 2021-06-02 DIAGNOSIS — L0591 Pilonidal cyst without abscess: Secondary | ICD-10-CM | POA: Diagnosis not present

## 2021-06-02 MED ORDER — SULFAMETHOXAZOLE-TRIMETHOPRIM 800-160 MG PO TABS: 1.0000 | ORAL_TABLET | Freq: Two times a day (BID) | ORAL | 0 refills | Status: DC

## 2021-06-02 NOTE — Assessment & Plan Note (Signed)
Acute He had surgery for the pilonidal cyst, but since then has had drainage that looks like pus and bleeding.  He is also experiencing pain Concern for possible infection-we will prescribe Bactrim DS twice daily x10 days Advised that he needs to follow-up with surgery

## 2021-06-02 NOTE — Assessment & Plan Note (Signed)
Acute Left distal posterior forearm Possibly leading to hair follicle, but nontender and does not appear to be infected This is benign appearing and reassured him

## 2021-06-02 NOTE — Patient Instructions (Signed)
   Follow up with surgery    Medications changes include :   Bactrim twice daily for 10 days.     Your prescription(s) have been submitted to your pharmacy. Please take as directed and contact our office if you believe you are having problem(s) with the medication(s).

## 2021-11-20 ENCOUNTER — Other Ambulatory Visit: Payer: Self-pay

## 2021-11-20 ENCOUNTER — Encounter: Payer: BC Managed Care – PPO | Attending: Physician Assistant | Admitting: Physician Assistant

## 2021-11-20 DIAGNOSIS — L0501 Pilonidal cyst with abscess: Secondary | ICD-10-CM | POA: Diagnosis not present

## 2021-11-20 DIAGNOSIS — L98492 Non-pressure chronic ulcer of skin of other sites with fat layer exposed: Secondary | ICD-10-CM | POA: Insufficient documentation

## 2021-11-20 DIAGNOSIS — Z87891 Personal history of nicotine dependence: Secondary | ICD-10-CM | POA: Insufficient documentation

## 2021-11-20 DIAGNOSIS — L0591 Pilonidal cyst without abscess: Secondary | ICD-10-CM | POA: Diagnosis not present

## 2021-11-20 DIAGNOSIS — T8131XA Disruption of external operation (surgical) wound, not elsewhere classified, initial encounter: Secondary | ICD-10-CM | POA: Diagnosis not present

## 2021-11-20 NOTE — Progress Notes (Addendum)
[name], [name] ([id number]) Visit Report for 11/20/2021 Chief Complaint Document Details Patient Name: [name], [name] Date of Service: 11/20/2021 12:45 PM Medical Record Number: [id number] Patient Account Number: [id number] Date of Birth/Sex: 09-11-1986 (36 y.o. M) Treating RN: [name] Primary Care Provider: Billey [name] Other Clinician: Referring Provider: Billey [name] Treating Provider/Extender: [name] in Treatment: 0 Information Obtained from: Patient Chief Complaint Disruption of pilonidal cyst removal Electronic Signature(s) Signed: 11/20/2021 1:25:26 PM By: [name] Keeler PA-C Entered By: [name] on 11/20/2021 13:25:26 [place], [name] ([id number]) -------------------------------------------------------------------------------- HPI Details Patient Name: [name] Date of Service: 11/20/2021 12:45 PM Medical Record Number: [id number] Patient Account Number: [id number] Date of Birth/Sex: 09-03-1986 (36 y.o. M) Treating RN: [name] Primary Care Provider: Billey [name] Other Clinician: Referring Provider: Billey [name] Treating Provider/Extender: [name] in Treatment: 0 History of Present Illness HPI Description: 11/20/2021 upon evaluation patient presents for initial inspection here in our clinic concerning a history of having had a pilonidal cyst summer 2022. Following that time he was told that it could take up to 6 months for this to heal therefore he is waited until now which was the 78-month mark to come in to be seen here at the wound care center. Unfortunately I am just not certain that this has been healing as well from an internal perspective it is what should be and to be honest it still draining but again there is significant depth to the wound that really has not been packed appropriately up to this point. I do believe that we do need to address that today. The good news is he is otherwise fairly healthy there is no signs of  systemic infection and locally the infection seems to be pretty well controlled at this time. I do not see significant purulent drainage although I do see quite a bit of drainage in the internal aspect of this is very friable currently. Electronic Signature(s) Signed: 11/20/2021 2:16:44 PM By: [name] Keeler PA-C Entered By: [name] on 11/20/2021 14:16:44 [name], [name] Bo ([id number]) -------------------------------------------------------------------------------- Physical Exam Details Patient Name: [name] Date of Service: 11/20/2021 12:45 PM Medical Record Number: [id number] Patient Account Number: [id number] Date of Birth/Sex: November 23, 1985 (36 y.o. M) Treating RN: [name] Primary Care Provider: Billey [name] Other Clinician: Referring Provider: Billey [name] Treating Provider/Extender: [name] in Treatment: 0 Constitutional sitting or standing blood pressure is within target range for patient.. pulse regular and within target range for patient.Marland Kitchen respirations regular, non- labored and within target range for patient.Marland Kitchen temperature within target range for patient.. Well-nourished and well-hydrated in no acute distress. Eyes conjunctiva clear no eyelid edema noted. pupils equal round and reactive to light and accommodation. Ears, Nose, Mouth, and Throat no gross abnormality of ear auricles or external auditory canals. normal hearing noted during conversation. mucus membranes moist. Respiratory normal breathing without difficulty. Cardiovascular 2+ dorsalis pedis/posterior tibialis pulses. no clubbing, cyanosis, significant edema, <3 sec cap refill. Musculoskeletal normal gait and posture. no significant deformity or arthritic changes, no loss or range of motion, no clubbing. Psychiatric this patient is able to make decisions and demonstrates good insight into disease process. Alert and Oriented x 3. pleasant and cooperative. Notes Upon inspection patient's  wound bed actually showed signs of good granulation and epithelization at upon evaluation patient's wound has quite a bit of depth at 3.5 cm. I think this is going to need to be packed and probably the easiest way to pack this is good to be with the  Hydrofera Blue rope. The question is whether we can do it full size or if we need to do it half-size. In the end I think half-size probably is the right way to go. The fold seem to be a little bit too large. The patient tolerated the packing without complication and once the wound obviously gets moist and its soft so it should not cause ongoing issues here. Electronic Signature(s) Signed: 11/20/2021 2:19:23 PM By: [name] Keeler PA-C Entered By: [name] on 11/20/2021 14:19:23 [name], [name] ([id number]) -------------------------------------------------------------------------------- Physician Orders Details Patient Name: [name] Date of Service: 11/20/2021 12:45 PM Medical Record Number: [id number] Patient Account Number: [id number] Date of Birth/Sex: 08/17/1986 (36 y.o. M) Treating RN: [name] Primary Care Provider: Billey [name] Other Clinician: Referring Provider: Billey [name] Treating Provider/Extender: [name] in Treatment: 0 Verbal / Phone Orders: No Diagnosis Coding ICD-10 Coding Code Description T81.31XA Disruption of external operation (surgical) wound, not elsewhere classified, initial encounter L98.492 Non-pressure chronic ulcer of skin of other sites with fat layer exposed L05.01 Pilonidal cyst with abscess Follow-up Appointments o Return Appointment in 1 week. o Nurse Visit as needed - come in for nurse visits x 2 weekly for dressing change, see doc x 1 week Bathing/ Shower/ Hygiene o May shower; gently cleanse wound with antibacterial soap, rinse and pat dry prior to dressing wounds - shower on days coming in to have dressing change o No tub bath. Wound Treatment Wound #1 - Sacrum  Wound Laterality: Midline Primary Dressing: Hydrofera Blue Classic Foam Rope Dressing, 9x6 (mm/in) 3 x Per Week/30 Days Discharge Instructions: rope needs to be cut in half to fit wound Secondary Dressing: Gauze 3 x Per Week/30 Days Discharge Instructions: As directed: dry, small 2x2 gauze, then large 4x4 gauze over Secondary Dressing: [place], 4x4 (in/in) 3 x Per Week/30 Days Discharge Instructions: Apply over dressing to secure in place. Secured With: 39M Medipore H Soft Cloth Surgical Tape, 2x2 (in/yd) 3 x Per Week/30 Days Electronic Signature(s) Signed: 11/22/2021 1:27:23 PM By: [name] Signed: 11/24/2021 4:48:07 PM By: [name] Keeler PA-C Previous Signature: 11/20/2021 5:07:33 PM Version By: [name] Previous Signature: 11/20/2021 5:27:53 PM Version By: [name] Keeler PA-C Entered By: [name] on 11/22/2021 13:27:23 [name], [name] ([id number]) -------------------------------------------------------------------------------- Problem List Details Patient Name: [name] Date of Service: 11/20/2021 12:45 PM Medical Record Number: [id number] Patient Account Number: [id number] Date of Birth/Sex: 12-11-1985 (36 y.o. M) Treating RN: [name] Primary Care Provider: Billey [name] Other Clinician: Referring Provider: Billey [name] Treating Provider/Extender: [name] in Treatment: 0 Active Problems ICD-10 Encounter Code Description Active Date MDM Diagnosis T81.31XA Disruption of external operation (surgical) wound, not elsewhere 11/20/2021 No Yes classified, initial encounter L98.492 Non-pressure chronic ulcer of skin of other sites with fat layer exposed 11/20/2021 No Yes L05.01 Pilonidal cyst with abscess 11/20/2021 No Yes Inactive Problems Resolved Problems Electronic Signature(s) Signed: 11/20/2021 1:24:49 PM By: [name] Keeler PA-C Entered By: [name] on 11/20/2021 13:24:48 [name], [name]  ([id number]) -------------------------------------------------------------------------------- Progress Note Details Patient Name: [name] Date of Service: 11/20/2021 12:45 PM Medical Record Number: [id number] Patient Account Number: [id number] Date of Birth/Sex: 03-18-1986 (36 y.o. M) Treating RN: [name] Primary Care Provider: Billey [name] Other Clinician: Referring Provider: Billey [name] Treating Provider/Extender: [name] in Treatment: 0 Subjective Chief Complaint Information obtained from Patient Disruption of pilonidal cyst removal History of Present Illness (HPI) 11/20/2021 upon evaluation patient presents for initial inspection here in our  clinic concerning a history of having had a pilonidal cyst summer 2022. Following that time he was told that it could take up to 6 months for this to heal therefore he is waited until now which was the 42-month mark to come in to be seen here at the wound care center. Unfortunately I am just not certain that this has been healing as well from an internal perspective it is what should be and to be honest it still draining but again there is significant depth to the wound that really has not been packed appropriately up to this point. I do believe that we do need to address that today. The good news is he is otherwise fairly healthy there is no signs of systemic infection and locally the infection seems to be pretty well controlled at this time. I do not see significant purulent drainage although I do see quite a bit of drainage in the internal aspect of this is very friable currently. Patient History Information obtained from Patient. Allergies No Known Drug Allergies Social History Former smoker - ended on 10/22/2010, Marital Status - Married, Alcohol Use - Moderate, Drug Use - No History, Caffeine Use - Daily - coffee. Review of Systems (ROS) Constitutional Symptoms (General Health) Complains or has symptoms of Marked  Weight Change. Eyes Denies complaints or symptoms of Dry Eyes, Vision Changes, Glasses / Contacts. Ear/Nose/Mouth/Throat Denies complaints or symptoms of Difficult clearing ears, Sinusitis. Hematologic/Lymphatic Denies complaints or symptoms of Bleeding / Clotting Disorders, Human Immunodeficiency Virus. Respiratory Denies complaints or symptoms of Chronic or frequent coughs, Shortness of Breath. Cardiovascular Denies complaints or symptoms of Chest pain, LE edema. Gastrointestinal Denies complaints or symptoms of Frequent diarrhea, Nausea, Vomiting. Endocrine Denies complaints or symptoms of Hepatitis, Thyroid disease, Polydypsia (Excessive Thirst). Genitourinary Denies complaints or symptoms of Kidney failure/ Dialysis, Incontinence/dribbling. Immunological Denies complaints or symptoms of Hives, Itching. Integumentary (Skin) Denies complaints or symptoms of Wounds, Bleeding or bruising tendency, Breakdown, Swelling. Musculoskeletal Denies complaints or symptoms of Muscle Pain, Muscle Weakness. Neurologic Denies complaints or symptoms of Numbness/parasthesias, Focal/Weakness. Psychiatric Denies complaints or symptoms of Anxiety, Claustrophobia. Objective [name], [name] ([id number]) Constitutional sitting or standing blood pressure is within target range for patient.. pulse regular and within target range for patient.Marland Kitchen respirations regular, non- labored and within target range for patient.Marland Kitchen temperature within target range for patient.. Well-nourished and well-hydrated in no acute distress. Vitals Time Taken: 1:08 PM, Height: 67 in, Source: Stated, Weight: 255 lbs, Source: Stated, BMI: 39.9, Temperature: 98.1 F, Pulse: 91 bpm, Respiratory Rate: 18 breaths/min, Blood Pressure: 137/93 mmHg. Eyes conjunctiva clear no eyelid edema noted. pupils equal round and reactive to light and accommodation. Ears, Nose, Mouth, and Throat no gross abnormality of ear auricles or external  auditory canals. normal hearing noted during conversation. mucus membranes moist. Respiratory normal breathing without difficulty. Cardiovascular 2+ dorsalis pedis/posterior tibialis pulses. no clubbing, cyanosis, significant edema, Musculoskeletal normal gait and posture. no significant deformity or arthritic changes, no loss or range of motion, no clubbing. Psychiatric this patient is able to make decisions and demonstrates good insight into disease process. Alert and Oriented x 3. pleasant and cooperative. General Notes: Upon inspection patient's wound bed actually showed signs of good granulation and epithelization at upon evaluation patient's wound has quite a bit of depth at 3.5 cm. I think this is going to need to be packed and probably the easiest way to pack this is good to be with the Hydrofera Blue rope. The question is whether we can  do it full size or if we need to do it half-size. In the end I think half-size probably is the right way to go. The fold seem to be a little bit too large. The patient tolerated the packing without complication and once the wound obviously gets moist and its soft so it should not cause ongoing issues here. Integumentary (Hair, Skin) Wound #1 status is Open. Original cause of wound was Surgical Injury. The date acquired was: 04/13/2021. The wound is located on the Midline Sacrum. The wound measures 1cm length x 0.5cm width x 3.5cm depth; 0.393cm^2 area and 1.374cm^3 volume. There is Fat Layer (Subcutaneous Tissue) exposed. There is no tunneling or undermining noted. There is a medium amount of serosanguineous drainage noted. There is large (67-100%) red granulation within the wound bed. There is no necrotic tissue within the wound bed. Assessment Active Problems ICD-10 Disruption of external operation (surgical) wound, not elsewhere classified, initial encounter Non-pressure chronic ulcer of skin of other sites with fat layer exposed Pilonidal cyst with  abscess Plan Follow-up Appointments: Return Appointment in 1 week. Nurse Visit as needed - come in for nurse visits x 2 weekly for dressing change, see doc x 1 week Bathing/ Shower/ Hygiene: May shower; gently cleanse wound with antibacterial soap, rinse and pat dry prior to dressing wounds - shower on days coming in to have dressing change No tub bath. WOUND #1: - Sacrum Wound Laterality: Midline Primary Dressing: Hydrofera Blue Classic Foam, 4x4 (in/in) 3 x Per Week/30 Days Discharge Instructions: rope needs to be cut in half Secondary Dressing: Gauze 3 x Per Week/30 Days Discharge Instructions: As directed: dry, small 2x2 gauze, then large 4x4 gauze over Secondary Dressing: [place], 4x4 (in/in) 3 x Per Week/30 Days Discharge Instructions: Apply over dressing to secure in place. Secured With: 63M Medipore H Soft Cloth Surgical Tape, 2x2 (in/yd) 3 x Per Week/30 Days [name], [name] ([id number]) 1. Would recommend currently that we going continue with the wound care measures as before and the patient is in agreement with plan. This includes the use of the Hydrofera Blue rope which I think is doing to do well with the pack into this area. 2. We will use gauze and behind this to hold everything in place and catch any drainage. 3. We will use a Telfa island dressing to secure in place over top. We will see patient back for reevaluation in 1 week here in the clinic. If anything worsens or changes patient will contact our office for additional recommendations. Electronic Signature(s) Signed: 11/20/2021 2:20:38 PM By: [name] Keeler PA-C Entered By: [name] on 11/20/2021 14:20:37 [name], [name] ([id number]) -------------------------------------------------------------------------------- ROS/PFSH Details Patient Name: [name] Date of Service: 11/20/2021 12:45 PM Medical Record Number: [id number] Patient Account Number: [id number] Date of Birth/Sex:  Jan 30, 1986 (36 y.o. M) Treating RN: [name] Primary Care Provider: Billey [name] Other Clinician: Referring Provider: Billey [name] Treating Provider/Extender: [name] in Treatment: 0 Information Obtained From Patient Constitutional Symptoms (General Health) Complaints and Symptoms: Positive for: Marked Weight Change Eyes Complaints and Symptoms: Negative for: Dry Eyes; Vision Changes; Glasses / Contacts Ear/Nose/Mouth/Throat Complaints and Symptoms: Negative for: Difficult clearing ears; Sinusitis Hematologic/Lymphatic Complaints and Symptoms: Negative for: Bleeding / Clotting Disorders; Human Immunodeficiency Virus Respiratory Complaints and Symptoms: Negative for: Chronic or frequent coughs; Shortness of Breath Cardiovascular Complaints and Symptoms: Negative for: Chest pain; LE edema Gastrointestinal Complaints and Symptoms: Negative for: Frequent diarrhea; Nausea; Vomiting Endocrine Complaints and Symptoms: Negative for: Hepatitis;  Thyroid disease; Polydypsia (Excessive Thirst) Genitourinary Complaints and Symptoms: Negative for: Kidney failure/ Dialysis; Incontinence/dribbling Immunological Complaints and Symptoms: Negative for: Hives; Itching Integumentary (Skin) Complaints and Symptoms: Negative for: Wounds; Bleeding or bruising tendency; Breakdown; Swelling [name], [name] ([id number]) Musculoskeletal Complaints and Symptoms: Negative for: Muscle Pain; Muscle Weakness Neurologic Complaints and Symptoms: Negative for: Numbness/parasthesias; Focal/Weakness Psychiatric Complaints and Symptoms: Negative for: Anxiety; Claustrophobia Oncologic Immunizations Pneumococcal Vaccine: Received Pneumococcal Vaccination: No Tetanus Vaccine: Last tetanus shot: 10/23/2011 Implantable Devices None Family and Social History Former smoker - ended on 10/22/2010; Marital Status - Married; Alcohol Use: Moderate; Drug Use: No History; Caffeine Use: Daily -  coffee Electronic Signature(s) Signed: 11/20/2021 5:07:33 PM By: [name] Signed: 11/20/2021 5:27:53 PM By: [name] Keeler PA-C Entered By: [name] on 11/20/2021 13:13:44 [name], [name] ([id number]) -------------------------------------------------------------------------------- SuperBill Details Patient Name: [name] Date of Service: 11/20/2021 Medical Record Number: [id number] Patient Account Number: [id number] Date of Birth/Sex: Nov 28, 1985 (36 y.o. M) Treating RN: [name] Primary Care Provider: Billey [name] Other Clinician: Referring Provider: Billey [name] Treating Provider/Extender: [name] in Treatment: 0 Diagnosis Coding ICD-10 Codes Code Description T81.31XA Disruption of external operation (surgical) wound, not elsewhere classified, initial encounter L98.492 Non-pressure chronic ulcer of skin of other sites with fat layer exposed L05.01 Pilonidal cyst with abscess Facility Procedures CPT4 Code: [id number] Description: 99213 - WOUND CARE VISIT-LEV 3 EST PT Modifier: Quantity: 1 Physician Procedures CPT4 Code: [id number] Description: [id number] - WC PHYS LEVEL 4 - NEW PT Modifier: Quantity: 1 CPT4 Code: Description: ICD-10 Diagnosis Description T81.31XA Disruption of external operation (surgical) wound, not elsewhere classifi L98.492 Non-pressure chronic ulcer of skin of other sites with fat layer exposed L05.01 Pilonidal cyst with abscess Modifier: ed, initial encounter Quantity: Electronic Signature(s) Signed: 11/20/2021 2:21:17 PM By: [name] Keeler PA-C Entered By: [name] on 11/20/2021 14:21:16

## 2021-11-20 NOTE — Progress Notes (Signed)
DEZHON, HOLLENBACK ([id number]) Visit Report for 11/20/2021 Allergy List Details Patient Name: [name], [name] Date of Service: 11/20/2021 12:45 PM Medical Record Number: [id number] Patient Account Number: [id number] Date of Birth/Sex: 1986-09-07 (36 y.o. M) Treating RN: Levora Dredge Primary Care Adithi Gammon: Billey Gosling Other Clinician: Referring Brytney Somes: Treating Nichalos Brenton/Extender: Jeri Cos Weeks in Treatment: 0 Allergies Active Allergies No Known Drug Allergies Allergy Notes Electronic Signature(s) Signed: 11/20/2021 5:07:33 PM By: Levora Dredge Entered By: Levora Dredge on 11/20/2021 13:10:30 [name] ([id number]) -------------------------------------------------------------------------------- Arrival Information Details Patient Name: [name] Date of Service: 11/20/2021 12:45 PM Medical Record Number: [id number] Patient Account Number: [id number] Date of Birth/Sex: Dec 29, 1985 (36 y.o. M) Treating RN: Levora Dredge Primary Care Tyrika Newman: Billey Gosling Other Clinician: Referring Jenyfer Trawick: Treating Deann Mclaine/Extender: Skipper Cliche in Treatment: 0 Visit Information Patient Arrived: Ambulatory Arrival Time: 13:06 Accompanied By: self Transfer Assistance: None Patient Identification Verified: Yes Secondary Verification Process Completed: Yes Electronic Signature(s) Signed: 11/20/2021 5:07:33 PM By: Levora Dredge Entered By: Levora Dredge on 11/20/2021 13:07:24 [name] ([id number]) -------------------------------------------------------------------------------- Clinic Level of Care Assessment Details Patient Name: [name] Date of Service: 11/20/2021 12:45 PM Medical Record Number: [id number] Patient Account Number: [id number] Date of Birth/Sex: 1986-05-30 (36 y.o. M) Treating RN: Levora Dredge Primary Care Zachry Hopfensperger: Billey Gosling Other Clinician: Referring Haille Pardi: Treating Brady Schiller/Extender: Skipper Cliche in Treatment: 0 Clinic  Level of Care Assessment Items TOOL 2 Quantity Score []  - Use when only an EandM is performed on the INITIAL visit 0 ASSESSMENTS - Nursing Assessment / Reassessment []  - General Physical Exam (combine w/ comprehensive assessment (listed just below) when performed on new 0 pt. evals) []  - 0 Comprehensive Assessment (HX, ROS, Risk Assessments, Wounds Hx, etc.) ASSESSMENTS - Wound and Skin Assessment / Reassessment X - Simple Wound Assessment / Reassessment - one wound 1 5 []  - 0 Complex Wound Assessment / Reassessment - multiple wounds []  - 0 Dermatologic / Skin Assessment (not related to wound area) ASSESSMENTS - Ostomy and/or Continence Assessment and Care []  - Incontinence Assessment and Management 0 []  - 0 Ostomy Care Assessment and Management (repouching, etc.) PROCESS - Coordination of Care X - Simple Patient / Family Education for ongoing care 1 15 []  - 0 Complex (extensive) Patient / Family Education for ongoing care X- 1 10 Staff obtains Programmer, systems, Records, Test Results / Process Orders []  - 0 Staff telephones HHA, Nursing Homes / Clarify orders / etc []  - 0 Routine Transfer to another Facility (non-emergent condition) []  - 0 Routine Hospital Admission (non-emergent condition) X- 1 15 New Admissions / Biomedical engineer / Ordering NPWT, Apligraf, etc. []  - 0 Emergency Hospital Admission (emergent condition) X- 1 10 Simple Discharge Coordination []  - 0 Complex (extensive) Discharge Coordination PROCESS - Special Needs []  - Pediatric / Minor Patient Management 0 []  - 0 Isolation Patient Management []  - 0 Hearing / Language / Visual special needs []  - 0 Assessment of Community assistance (transportation, D/C planning, etc.) []  - 0 Additional assistance / Altered mentation []  - 0 Support Surface(s) Assessment (bed, cushion, seat, etc.) INTERVENTIONS - Wound Cleansing / Measurement X - Wound Imaging (photographs - any number of wounds) 1 5 []  - 0 Wound  Tracing (instead of photographs) X- 1 5 Simple Wound Measurement - one wound []  - 0 Complex Wound Measurement - multiple wounds [name], [name] ([id number]) X- 1 5 Simple Wound Cleansing - one wound []  - 0 Complex Wound Cleansing - multiple wounds INTERVENTIONS - Wound Dressings X - Small Wound Dressing one or multiple  wounds 1 10 []  - 0 Medium Wound Dressing one or multiple wounds []  - 0 Large Wound Dressing one or multiple wounds []  - 0 Application of Medications - injection INTERVENTIONS - Miscellaneous []  - External ear exam 0 []  - 0 Specimen Collection (cultures, biopsies, blood, body fluids, etc.) []  - 0 Specimen(s) / Culture(s) sent or taken to Lab for analysis []  - 0 Patient Transfer (multiple staff / Harrel Lemon Lift / Similar devices) []  - 0 Simple Staple / Suture removal (25 or less) []  - 0 Complex Staple / Suture removal (26 or more) []  - 0 Hypo / Hyperglycemic Management (close monitor of Blood Glucose) []  - 0 Ankle / Brachial Index (ABI) - do not check if billed separately Has the patient been seen at the hospital within the last three years: Yes Total Score: 80 Level Of Care: New/Established - Level 3 Electronic Signature(s) Signed: 11/20/2021 5:07:33 PM By: Levora Dredge Entered By: Levora Dredge on 11/20/2021 13:53:52 [name], [name] ([id number]) -------------------------------------------------------------------------------- Encounter Discharge Information Details Patient Name: [name] Date of Service: 11/20/2021 12:45 PM Medical Record Number: [id number] Patient Account Number: [id number] Date of Birth/Sex: 05-15-1986 (36 y.o. M) Treating RN: Levora Dredge Primary Care Forest Redwine: Billey Gosling Other Clinician: Referring Kwali Wrinkle: Treating Bessie Boyte/Extender: Skipper Cliche in Treatment: 0 Encounter Discharge Information Items Discharge Condition: Stable Ambulatory Status: Ambulatory Discharge Destination: Home Transportation: Private  Auto Accompanied By: self Schedule Follow-up Appointment: Yes Clinical Summary of Care: Electronic Signature(s) Signed: 11/20/2021 5:07:33 PM By: Levora Dredge Entered By: Levora Dredge on 11/20/2021 13:57:14 [name], [name] ([id number]) -------------------------------------------------------------------------------- Lower Extremity Assessment Details Patient Name: [name] Date of Service: 11/20/2021 12:45 PM Medical Record Number: [id number] Patient Account Number: [id number] Date of Birth/Sex: [birth year] (36 y.o. M) Treating RN: Levora Dredge Primary Care Asako Saliba: Billey Gosling Other Clinician: Referring Sahily Biddle: Treating Justyn Langham/Extender: Skipper Cliche in Treatment: 0 Notes not needed due location of wound Electronic Signature(s) Signed: 11/20/2021 5:07:33 PM By: Levora Dredge Entered By: Levora Dredge on 11/20/2021 13:16:25 [name], [name] ([id number]) -------------------------------------------------------------------------------- Multi Wound Chart Details Patient Name: [name] Date of Service: 11/20/2021 12:45 PM Medical Record Number: [id number] Patient Account Number: [id number] Date of Birth/Sex: 05-23-1986 (36 y.o. M) Treating RN: Levora Dredge Primary Care Esteban Kobashigawa: Billey Gosling Other Clinician: Referring Kiki Bivens: Treating Marlena Barbato/Extender: Skipper Cliche in Treatment: 0 Vital Signs Height(in): 67 Pulse(bpm): 91 Weight(lbs): 255 Blood Pressure(mmHg): 137/93 Body Mass Index(BMI): 39.9 Temperature(F): 98.1 Respiratory Rate(breaths/min): 18 Photos: [N/A:N/A] Wound Location: Midline Sacrum N/A N/A Wounding Event: Surgical Injury N/A N/A Primary Etiology: Pilonidal Cyst N/A N/A Date Acquired: 04/13/2021 N/A N/A Weeks of Treatment: 0 N/A N/A Wound Status: Open N/A N/A Wound Recurrence: No N/A N/A Measurements L x W x D (cm) 1x0.5x3.5 N/A N/A Area (cm) : 0.393 N/A N/A Volume (cm) : 1.374 N/A N/A % Reduction in Area: 0.00% N/A  N/A % Reduction in Volume: -3423.10% N/A N/A Classification: Full Thickness Without Exposed N/A N/A Support Structures Exudate Amount: Medium N/A N/A Exudate Type: Serosanguineous N/A N/A Exudate Color: red, brown N/A N/A Granulation Amount: Large (67-100%) N/A N/A Granulation Quality: Red N/A N/A Necrotic Amount: None Present (0%) N/A N/A Exposed Structures: Fat Layer (Subcutaneous Tissue): N/A N/A Yes Epithelialization: None N/A N/A Treatment Notes Electronic Signature(s) Signed: 11/20/2021 5:07:33 PM By: Levora Dredge Entered By: Levora Dredge on 11/20/2021 13:35:50 [name] ([id number]) -------------------------------------------------------------------------------- Multi-Disciplinary Care Plan Details Patient Name: [name] Date of Service: 11/20/2021 12:45 PM Medical Record Number: [id number] Patient Account Number: [id number] Date of Birth/Sex: 06/13/1986 (36 y.o. M)  Treating RN: Levora Dredge Primary Care Broly Hatfield: Billey Gosling Other Clinician: Referring Amauria Younts: Treating Kamy Poinsett/Extender: Skipper Cliche in Treatment: 0 Active Inactive Orientation to the Wound Care Program Nursing Diagnoses: Knowledge deficit related to the wound healing center program Goals: Patient/caregiver will verbalize understanding of the [facility] Program Date Initiated: 11/20/2021 Target Resolution Date: 11/20/2021 Goal Status: Active Interventions: Provide education on orientation to the wound center Notes: Wound/Skin Impairment Nursing Diagnoses: Impaired tissue integrity Knowledge deficit related to ulceration/compromised skin integrity Goals: Ulcer/skin breakdown will have a volume reduction of 30% by week 4 Date Initiated: 11/20/2021 Target Resolution Date: 12/18/2021 Goal Status: Active Ulcer/skin breakdown will have a volume reduction of 50% by week 8 Date Initiated: 11/20/2021 Target Resolution Date: 01/15/2022 Goal Status: Active Ulcer/skin  breakdown will have a volume reduction of 80% by week 12 Date Initiated: 11/20/2021 Target Resolution Date: 02/12/2022 Goal Status: Active Ulcer/skin breakdown will heal within 14 weeks Date Initiated: 11/20/2021 Target Resolution Date: 02/26/2022 Goal Status: Active Interventions: Assess patient/caregiver ability to obtain necessary supplies Assess patient/caregiver ability to perform ulcer/skin care regimen upon admission and as needed Assess ulceration(s) every visit Provide education on ulcer and skin care Notes: Electronic Signature(s) Signed: 11/20/2021 5:07:33 PM By: Levora Dredge Entered By: Levora Dredge on 11/20/2021 13:35:38 [name], [name] ([id number]) -------------------------------------------------------------------------------- Pain Assessment Details Patient Name: [name] Date of Service: 11/20/2021 12:45 PM Medical Record Number: [id number] Patient Account Number: [id number] Date of Birth/Sex: May 05, 1986 (36 y.o. M) Treating RN: Levora Dredge Primary Care Levent Kornegay: Billey Gosling Other Clinician: Referring Makynli Stills: Treating Remer Couse/Extender: Skipper Cliche in Treatment: 0 Active Problems Location of Pain Severity and Description of Pain Patient Has Paino No Site Locations Rate the pain. Current Pain Level: 0 Pain Management and Medication Current Pain Management: Electronic Signature(s) Signed: 11/20/2021 5:07:33 PM By: Levora Dredge Entered By: Levora Dredge on 11/20/2021 13:08:02 [name] ([id number]) -------------------------------------------------------------------------------- Patient/Caregiver Education Details Patient Name: [name] Date of Service: 11/20/2021 12:45 PM Medical Record Number: [id number] Patient Account Number: [id number] Date of Birth/Gender: 1986/06/03 (36 y.o. M) Treating RN: Levora Dredge Primary Care Physician: Billey Gosling Other Clinician: Referring Physician: Treating Physician/Extender: Skipper Cliche in Treatment: 0 Education Assessment Education Provided To: Patient Education Topics Provided Welcome To [facility]: Handouts: Welcome To [facility] Methods: Explain/Verbal Responses: State content correctly Wound/Skin Impairment: Handouts: Caring for Your Ulcer Methods: Explain/Verbal Responses: State content correctly Electronic Signature(s) Signed: 11/20/2021 5:07:33 PM By: Levora Dredge Entered By: Levora Dredge on 11/20/2021 13:54:29 [name], [name] ([id number]) -------------------------------------------------------------------------------- Wound Assessment Details Patient Name: [name] Date of Service: 11/20/2021 12:45 PM Medical Record Number: [id number] Patient Account Number: [id number] Date of Birth/Sex: Jan 27, 1986 (36 y.o. M) Treating RN: Levora Dredge Primary Care Yaretzi Ernandez: Billey Gosling Other Clinician: Referring Aaniyah Strohm: Treating Gervis Gaba/Extender: Skipper Cliche in Treatment: 0 Wound Status Wound Number: 1 Primary Etiology: Pilonidal Cyst Wound Location: Midline Sacrum Wound Status: Open Wounding Event: Surgical Injury Date Acquired: 04/13/2021 Weeks Of Treatment: 0 Clustered Wound: No Photos Wound Measurements Length: (cm) 1 Width: (cm) 0.5 Depth: (cm) 3.5 Area: (cm) 0.393 Volume: (cm) 1.374 % Reduction in Area: 0% % Reduction in Volume: -3423.1% Epithelialization: None Tunneling: No Undermining: No Wound Description Classification: Full Thickness Without Exposed Support Structu Exudate Amount: Medium Exudate Type: Serosanguineous Exudate Color: red, brown res Foul Odor After Cleansing: No Slough/Fibrino Yes Wound Bed Granulation Amount: Large (67-100%) Exposed Structure Granulation Quality: Red Fat Layer (Subcutaneous Tissue) Exposed: Yes Necrotic Amount: None Present (0%) Treatment Notes Wound #1 (Sacrum)  Wound Laterality: Midline Cleanser Peri-Wound Care Topical Primary  Dressing Hydrofera Blue Classic Foam, [name] (in/in) Discharge Instruction: rope needs to be cut in half Secondary Dressing CAMIL, TREMBATH ([id number]) Gauze Discharge Instruction: As directed: dry, small 2x2 gauze, then large [name] gauze over [name], [name] (in/in) Discharge Instruction: Apply over dressing to secure in place. Secured With 40M Medipore H Soft Cloth Surgical Tape, 2x2 (in/yd) Compression Wrap Compression Stockings Add-Ons Electronic Signature(s) Signed: 11/20/2021 5:07:33 PM By: Levora Dredge Entered By: Levora Dredge on 11/20/2021 13:35:18 [name], [name] ([id number]) -------------------------------------------------------------------------------- Vitals Details Patient Name: [name] Date of Service: 11/20/2021 12:45 PM Medical Record Number: [id number] Patient Account Number: [id number] Date of Birth/Sex: 10-Apr-1986 (36 y.o. M) Treating RN: Levora Dredge Primary Care Javarie Crisp: Billey Gosling Other Clinician: Referring Tashanti Dalporto: Treating Elaysia Devargas/Extender: Skipper Cliche in Treatment: 0 Vital Signs Time Taken: 13:08 Temperature (F): 98.1 Height (in): 67 Pulse (bpm): 91 Source: Stated Respiratory Rate (breaths/min): 18 Weight (lbs): 255 Blood Pressure (mmHg): 137/93 Source: Stated Reference Range: 80 - 120 mg / dl Body Mass Index (BMI): 39.9 Electronic Signature(s) Signed: 11/20/2021 5:07:33 PM By: Levora Dredge Entered By: Levora Dredge on 11/20/2021 13:08:38

## 2021-11-20 NOTE — Progress Notes (Signed)
[name], [name] ([id number]) Visit Report for 11/20/2021 Abuse Risk Screen Details Patient Name: [name], [name] Date of Service: 11/20/2021 12:45 PM Medical Record Number: [id number] Patient Account Number: [id number] Date of Birth/Sex: 04-Sep-1986 (36 y.o. M) Treating RN: Levora Dredge Primary Care Nohemi Nicklaus: Billey Gosling Other Clinician: Referring Whitaker Holderman: Treating Loucinda Croy/Extender: Skipper Cliche in Treatment: 0 Abuse Risk Screen Items Answer ABUSE RISK SCREEN: Has anyone close to you tried to hurt or harm you recentlyo No Do you feel uncomfortable with anyone in your familyo No Has anyone forced you do things that you didnot want to doo No Electronic Signature(s) Signed: 11/20/2021 5:07:33 PM By: Levora Dredge Entered By: Levora Dredge on 11/20/2021 13:13:54 [name], [name] ([id number]) -------------------------------------------------------------------------------- Activities of Daily Living Details Patient Name: [name], [name] Date of Service: 11/20/2021 12:45 PM Medical Record Number: [id number] Patient Account Number: [id number] Date of Birth/Sex: Feb 13, 1986 (36 y.o. M) Treating RN: Levora Dredge Primary Care Oakleigh Hesketh: Billey Gosling Other Clinician: Referring Yojan Paskett: Treating Anastaisa Wooding/Extender: Skipper Cliche in Treatment: 0 Activities of Daily Living Items Answer Activities of Daily Living (Please select one for each item) Drive Automobile Completely Able Take Medications Completely Able Use Telephone Completely Able Care for Appearance Completely Able Use Toilet Completely Able Bath / Shower Completely Able Dress Self Completely Able Feed Self Completely Able Walk Completely Able Get In / Out Bed Completely Able Housework Completely Able Prepare Meals Completely Able Handle Money Completely Able Shop for Self Completely Able Electronic Signature(s) Signed: 11/20/2021 5:07:33 PM By: Levora Dredge Entered By: Levora Dredge on 11/20/2021  13:14:22 [name] ([id number]) -------------------------------------------------------------------------------- Education Screening Details Patient Name: [name] Date of Service: 11/20/2021 12:45 PM Medical Record Number: [id number] Patient Account Number: [id number] Date of Birth/Sex: [birth year] (36 y.o. M) Treating RN: Levora Dredge Primary Care Keshauna Degraffenreid: Billey Gosling Other Clinician: Referring Sharalee Witman: Treating Alethea Terhaar/Extender: Skipper Cliche in Treatment: 0 Primary Learner Assessed: Patient Learning Preferences/Education Level/Primary Language Learning Preference: Explanation, Demonstration, Video, Communication Board, Printed Material Highest Education Level: College or Above Preferred Language: English Cognitive Barrier Language Barrier: No Translator Needed: No Memory Deficit: No Emotional Barrier: No Cultural/Religious Beliefs Affecting Medical Care: No Physical Barrier Impaired Vision: No Impaired Hearing: No Decreased Hand dexterity: No Knowledge/Comprehension Knowledge Level: High Comprehension Level: High Ability to understand written instructions: High Ability to understand verbal instructions: High Motivation Anxiety Level: Calm Cooperation: Cooperative Education Importance: Acknowledges Need Interest in Health Problems: Asks Questions Perception: Coherent Willingness to Engage in Self-Management High Activities: Readiness to Engage in Self-Management High Activities: Electronic Signature(s) Signed: 11/20/2021 5:07:33 PM By: Levora Dredge Entered By: Levora Dredge on 11/20/2021 13:14:51 [name] ([id number]) -------------------------------------------------------------------------------- Fall Risk Assessment Details Patient Name: [name] Date of Service: 11/20/2021 12:45 PM Medical Record Number: [id number] Patient Account Number: [id number] Date of Birth/Sex: 04-05-1986 (36 y.o. M) Treating RN: Levora Dredge Primary Care Adrielle Polakowski: Billey Gosling Other Clinician: Referring Malikiah Debarr: Treating Tvisha Schwoerer/Extender: Skipper Cliche in Treatment: 0 Fall Risk Assessment Items Have you had 2 or more falls in the last 12 monthso 0 No Have you had any fall that resulted in injury in the last 12 monthso 0 No FALLS RISK SCREEN History of falling - immediate or within 3 months 0 No Secondary diagnosis (Do you have 2 or more medical diagnoseso) 0 No Ambulatory aid None/bed rest/wheelchair/nurse 0 Yes Crutches/cane/walker 0 No Furniture 0 No Intravenous therapy Access/Saline/Heparin Lock 0 No Gait/Transferring Normal/ bed rest/ wheelchair 0 Yes Weak (short steps with or without shuffle, stooped but able to lift head while walking, may 0  No seek support from furniture) Impaired (short steps with shuffle, may have difficulty arising from chair, head down, impaired 0 No balance) Mental Status Oriented to own ability 0 Yes Electronic Signature(s) Signed: 11/20/2021 5:07:33 PM By: Levora Dredge Entered By: Levora Dredge on 11/20/2021 13:15:04 [name], [name] ([id number]) -------------------------------------------------------------------------------- Foot Assessment Details Patient Name: [name] Date of Service: 11/20/2021 12:45 PM Medical Record Number: [id number] Patient Account Number: [id number] Date of Birth/Sex: 19-Jan-1986 (36 y.o. M) Treating RN: Levora Dredge Primary Care Shamarion Coots: Billey Gosling Other Clinician: Referring Calliope Delangel: Treating Adalid Beckmann/Extender: Skipper Cliche in Treatment: 0 Foot Assessment Items Site Locations + = Sensation present, - = Sensation absent, C = Callus, U = Ulcer R = Redness, W = Warmth, M = Maceration, PU = Pre-ulcerative lesion F = Fissure, S = Swelling, D = Dryness Assessment Right: Left: Other Deformity: No No Prior Foot Ulcer: No No Prior Amputation: No No Charcot Joint: No No Ambulatory Status: Ambulatory Without Help Gait:  Steady Notes not assessed due to location of wound Electronic Signature(s) Signed: 11/20/2021 5:07:33 PM By: Levora Dredge Entered By: Levora Dredge on 11/20/2021 13:15:33 [name], [name] ([id number]) -------------------------------------------------------------------------------- Nutrition Risk Screening Details Patient Name: [name] Date of Service: 11/20/2021 12:45 PM Medical Record Number: [id number] Patient Account Number: [id number] Date of Birth/Sex: 1986-04-03 (36 y.o. M) Treating RN: Levora Dredge Primary Care Avante Carneiro: Billey Gosling Other Clinician: Referring Cordia Miklos: Treating Elainna Eshleman/Extender: Skipper Cliche in Treatment: 0 Height (in): 67 Weight (lbs): 255 Body Mass Index (BMI): 39.9 Nutrition Risk Screening Items Score Screening NUTRITION RISK SCREEN: I have an illness or condition that made me change the kind and/or amount of food I eat 0 No I eat fewer than two meals per day 0 No I eat few fruits and vegetables, or milk products 0 No I have three or more drinks of beer, liquor or wine almost every day 0 No I have tooth or mouth problems that make it hard for me to eat 0 No I don't always have enough money to buy the food I need 0 No I eat alone most of the time 0 No I take three or more different prescribed or over-the-counter drugs a day 0 No Without wanting to, I have lost or gained 10 pounds in the last six months 0 No I am not always physically able to shop, cook and/or feed myself 0 No Nutrition Protocols Good Risk Protocol 0 No interventions needed Moderate Risk Protocol High Risk Proctocol Risk Level: Good Risk Score: 0 Electronic Signature(s) Signed: 11/20/2021 5:07:33 PM By: Levora Dredge Entered By: Levora Dredge on 11/20/2021 13:15:15

## 2021-11-22 ENCOUNTER — Ambulatory Visit: Payer: BC Managed Care – PPO

## 2021-11-23 ENCOUNTER — Other Ambulatory Visit: Payer: Self-pay

## 2021-11-23 ENCOUNTER — Encounter: Payer: BC Managed Care – PPO | Attending: Physician Assistant

## 2021-11-23 DIAGNOSIS — L0501 Pilonidal cyst with abscess: Secondary | ICD-10-CM | POA: Diagnosis not present

## 2021-11-23 DIAGNOSIS — L98492 Non-pressure chronic ulcer of skin of other sites with fat layer exposed: Secondary | ICD-10-CM | POA: Diagnosis not present

## 2021-11-23 DIAGNOSIS — Y838 Other surgical procedures as the cause of abnormal reaction of the patient, or of later complication, without mention of misadventure at the time of the procedure: Secondary | ICD-10-CM | POA: Diagnosis not present

## 2021-11-23 DIAGNOSIS — T8131XA Disruption of external operation (surgical) wound, not elsewhere classified, initial encounter: Secondary | ICD-10-CM | POA: Insufficient documentation

## 2021-11-23 NOTE — Progress Notes (Addendum)
[name], DOUBLE ([id number]) Visit Report for 11/23/2021 Arrival Information Details Patient Name: [name], [name] Date of Service: 11/23/2021 8:00 AM Medical Record Number: [id number] Patient Account Number: [id number] Date of Birth/Sex: 1986/03/30 (36 y.o. M) Treating RN: Donnamarie Poag Primary Care Abdallah Hern: Billey Gosling Other Clinician: Referring Luetta Piazza: Billey Gosling Treating Eldrick Penick/Extender: Skipper Cliche in Treatment: 0 Visit Information History Since Last Visit Added or deleted any medications: No Patient Arrived: Ambulatory Had a fall or experienced change in No Arrival Time: 08:05 activities of daily living that may affect Accompanied By: SELF risk of falls: Transfer Assistance: None Hospitalized since last visit: No Patient Identification Verified: Yes Has Dressing in Place as Prescribed: Yes Secondary Verification Process Completed: Yes Pain Present Now: Yes Patient Requires Transmission-Based Precautions: No Electronic Signature(s) Signed: 11/23/2021 8:29:56 AM By: Donnamarie Poag Entered By: Donnamarie Poag on 11/23/2021 08:29:56 Mincey, Lotus ([id number]) -------------------------------------------------------------------------------- Clinic Level of Care Assessment Details Patient Name: [name] Date of Service: 11/23/2021 8:00 AM Medical Record Number: [id number] Patient Account Number: [id number] Date of Birth/Sex: 08-Sep-1986 (36 y.o. M) Treating RN: Donnamarie Poag Primary Care Nneka Blanda: Billey Gosling Other Clinician: Referring Alexsia Klindt: Billey Gosling Treating Sanders Manninen/Extender: Skipper Cliche in Treatment: 0 Clinic Level of Care Assessment Items TOOL 4 Quantity Score []  - Use when only an EandM is performed on FOLLOW-UP visit 0 ASSESSMENTS - Nursing Assessment / Reassessment []  - Reassessment of Co-morbidities (includes updates in patient status) 0 []  - 0 Reassessment of Adherence to Treatment Plan ASSESSMENTS - Wound and Skin Assessment / Reassessment X -  Simple Wound Assessment / Reassessment - one wound 1 5 []  - 0 Complex Wound Assessment / Reassessment - multiple wounds []  - 0 Dermatologic / Skin Assessment (not related to wound area) ASSESSMENTS - Focused Assessment []  - Circumferential Edema Measurements - multi extremities 0 []  - 0 Nutritional Assessment / Counseling / Intervention []  - 0 Lower Extremity Assessment (monofilament, tuning fork, pulses) []  - 0 Peripheral Arterial Disease Assessment (using hand held doppler) ASSESSMENTS - Ostomy and/or Continence Assessment and Care []  - Incontinence Assessment and Management 0 []  - 0 Ostomy Care Assessment and Management (repouching, etc.) PROCESS - Coordination of Care X - Simple Patient / Family Education for ongoing care 1 15 []  - 0 Complex (extensive) Patient / Family Education for ongoing care []  - 0 Staff obtains Programmer, systems, Records, Test Results / Process Orders []  - 0 Staff telephones HHA, Nursing Homes / Clarify orders / etc []  - 0 Routine Transfer to another Facility (non-emergent condition) []  - 0 Routine Hospital Admission (non-emergent condition) []  - 0 New Admissions / Biomedical engineer / Ordering NPWT, Apligraf, etc. []  - 0 Emergency Hospital Admission (emergent condition) X- 1 10 Simple Discharge Coordination []  - 0 Complex (extensive) Discharge Coordination PROCESS - Special Needs []  - Pediatric / Minor Patient Management 0 []  - 0 Isolation Patient Management []  - 0 Hearing / Language / Visual special needs []  - 0 Assessment of Community assistance (transportation, D/C planning, etc.) []  - 0 Additional assistance / Altered mentation []  - 0 Support Surface(s) Assessment (bed, cushion, seat, etc.) INTERVENTIONS - Wound Cleansing / Measurement Laiche, Shinichi ([id number]) X- 1 5 Simple Wound Cleansing - one wound []  - 0 Complex Wound Cleansing - multiple wounds []  - 0 Wound Imaging (photographs - any number of wounds) []  - 0 Wound Tracing  (instead of photographs) X- 1 5 Simple Wound Measurement - one wound []  - 0 Complex Wound Measurement - multiple wounds INTERVENTIONS - Wound Dressings X - Small  Wound Dressing one or multiple wounds 1 10 []  - 0 Medium Wound Dressing one or multiple wounds []  - 0 Large Wound Dressing one or multiple wounds []  - 0 Application of Medications - topical []  - 0 Application of Medications - injection INTERVENTIONS - Miscellaneous []  - External ear exam 0 []  - 0 Specimen Collection (cultures, biopsies, blood, body fluids, etc.) []  - 0 Specimen(s) / Culture(s) sent or taken to Lab for analysis []  - 0 Patient Transfer (multiple staff / Harrel Lemon Lift / Similar devices) []  - 0 Simple Staple / Suture removal (25 or less) []  - 0 Complex Staple / Suture removal (26 or more) []  - 0 Hypo / Hyperglycemic Management (close monitor of Blood Glucose) []  - 0 Ankle / Brachial Index (ABI) - do not check if billed separately []  - 0 Vital Signs Has the patient been seen at the hospital within the last three years: Yes Total Score: 50 Level Of Care: New/Established - Level 2 Electronic Signature(s) Unsigned Entered ByDonnamarie Poag on 11/23/2021 08:35:38 Signature(s): Date(s): LINC, FLAMENCO ([id number]) -------------------------------------------------------------------------------- Encounter Discharge Information Details Patient Name: [name] Date of Service: 11/23/2021 8:00 AM Medical Record Number: [id number] Patient Account Number: [id number] Date of Birth/Sex: [birth year] (36 y.o. M) Treating RN: Donnamarie Poag Primary Care Avriana Joo: Billey Gosling Other Clinician: Referring Chenita Ruda: Billey Gosling Treating Rocklyn Mayberry/Extender: Skipper Cliche in Treatment: 0 Encounter Discharge Information Items Discharge Condition: Stable Ambulatory Status: Ambulatory Discharge Destination: Home Transportation: Private Auto Accompanied By: self Schedule Follow-up Appointment: Yes Clinical Summary  of Care: Electronic Signature(s) Signed: 11/23/2021 8:35:16 AM By: Donnamarie Poag Entered By: Donnamarie Poag on 11/23/2021 08:35:15 Yokum, [name] ([id number]) -------------------------------------------------------------------------------- Wound Assessment Details Patient Name: [name] Date of Service: 11/23/2021 8:00 AM Medical Record Number: [id number] Patient Account Number: [id number] Date of Birth/Sex: 10/26/1985 (36 y.o. M) Treating RN: Donnamarie Poag Primary Care Hasheem Voland: Billey Gosling Other Clinician: Referring Sachin Ferencz: Billey Gosling Treating Corneluis Allston/Extender: Jeri Cos Weeks in Treatment: 0 Wound Status Wound Number: 1 Primary Etiology: Pilonidal Cyst Wound Location: Midline Sacrum Wound Status: Open Wounding Event: Surgical Injury Date Acquired: 04/13/2021 Weeks Of Treatment: 0 Clustered Wound: No Wound Measurements Length: (cm) 1 Width: (cm) 0.5 Depth: (cm) 3.5 Area: (cm) 0.393 Volume: (cm) 1.374 % Reduction in Area: 0% % Reduction in Volume: 0% Epithelialization: None Wound Description Classification: Full Thickness Without Exposed Support Structu Exudate Amount: Medium Exudate Type: Serosanguineous Exudate Color: red, brown res Foul Odor After Cleansing: No Slough/Fibrino Yes Wound Bed Granulation Amount: Large (67-100%) Exposed Structure Granulation Quality: Red Fat Layer (Subcutaneous Tissue) Exposed: Yes Necrotic Amount: Small (1-33%) Necrotic Quality: Adherent Slough Treatment Notes Wound #1 (Sacrum) Wound Laterality: Midline Cleanser Peri-Wound Care Topical Primary Dressing Hydrofera Blue Classic Foam Rope Dressing, 9x6 (mm/in) Discharge Instruction: rope needs to be cut in half to fit wound Secondary Dressing Gauze Discharge Instruction: As directed: dry, small 2x2 gauze, then large 4x4 gauze over [place], 4x4 (in/in) Discharge Instruction: Apply over dressing to secure in place. Secured With 35M Medipore H Soft Cloth  Surgical Tape, 2x2 (in/yd) Compression Wrap Compression Stockings Add-Ons ORLANDER, BIRDWELL ([id number]) Electronic Signature(s) Signed: 11/23/2021 8:30:22 AM By: Donnamarie Poag Entered ByDonnamarie Poag on 11/23/2021 08:30:22

## 2021-11-24 ENCOUNTER — Ambulatory Visit: Payer: BC Managed Care – PPO

## 2021-11-24 NOTE — Progress Notes (Signed)
GRANTLEY, SAVAGE ([id number]) Visit Report for 11/23/2021 Physician Orders Details Patient Name: [name], [name] Date of Service: 11/23/2021 8:00 AM Medical Record Number: [id number] Patient Account Number: [id number] Date of Birth/Sex: [birth year] (36 y.o. M) Treating RN: Hansel Feinstein Primary Care Provider: Cheryll Cockayne Other Clinician: Referring Provider: Cheryll Cockayne Treating Provider/Extender: Rowan Blase in Treatment: 0 Verbal / Phone Orders: No Diagnosis Coding Follow-up Appointments o Return Appointment in 1 week. o Nurse Visit as needed - come in for nurse visits x 2 weekly for dressing change, see doc x 1 week Bathing/ Shower/ Hygiene o May shower; gently cleanse wound with antibacterial soap, rinse and pat dry prior to dressing wounds - shower on days coming in to have dressing change o No tub bath. Anesthetic (Use 'Patient Medications' Section for Anesthetic Order Entry) o Lidocaine applied to wound bed Wound Treatment Wound #1 - Sacrum Wound Laterality: Midline Primary Dressing: Hydrofera Blue Classic Foam Rope Dressing, 9x6 (mm/in) 3 x Per Week/30 Days Discharge Instructions: rope needs to be cut in half to fit wound Secondary Dressing: Gauze 3 x Per Week/30 Days Discharge Instructions: As directed: dry, small 2x2 gauze, then large 4x4 gauze over Secondary Dressing: Telfa Adhesive [place] Dressing, 4x4 (in/in) 3 x Per Week/30 Days Discharge Instructions: Apply over dressing to secure in place. Secured With: 61M Medipore H Soft Cloth Surgical Tape, 2x2 (in/yd) 3 x Per Week/30 Days Electronic Signature(s) Signed: 11/23/2021 8:34:34 AM By: Hansel Feinstein Signed: 11/24/2021 4:48:07 PM By: Lenda Kelp PA-C Entered By: Hansel Feinstein on 11/23/2021 08:34:34 [place], [place] ([id number]) -------------------------------------------------------------------------------- SuperBill Details Patient Name: [name] Date of Service: 11/23/2021 Medical Record Number:  [id number] Patient Account Number: [id number] Date of Birth/Sex: 1986-04-16 (36 y.o. 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

## 2021-11-27 ENCOUNTER — Encounter: Payer: BC Managed Care – PPO | Admitting: Physician Assistant

## 2021-11-27 ENCOUNTER — Other Ambulatory Visit: Payer: Self-pay

## 2021-11-27 DIAGNOSIS — L98492 Non-pressure chronic ulcer of skin of other sites with fat layer exposed: Secondary | ICD-10-CM | POA: Diagnosis not present

## 2021-11-27 DIAGNOSIS — L0501 Pilonidal cyst with abscess: Secondary | ICD-10-CM | POA: Diagnosis not present

## 2021-11-27 DIAGNOSIS — T8131XA Disruption of external operation (surgical) wound, not elsewhere classified, initial encounter: Secondary | ICD-10-CM | POA: Diagnosis not present

## 2021-11-27 DIAGNOSIS — Y838 Other surgical procedures as the cause of abnormal reaction of the patient, or of later complication, without mention of misadventure at the time of the procedure: Secondary | ICD-10-CM | POA: Diagnosis not present

## 2021-11-27 NOTE — Progress Notes (Signed)
[name], [name] ([id number]) Visit Report for 11/27/2021 Arrival Information Details Patient Name: [name] Date of Service: 11/27/2021 9:00 AM Medical Record Number: [id number] Patient Account Number: [id number] Date of Birth/Sex: 09/08/1986 (36 y.o. M) Treating RN: Angelina Pih Primary Care Asucena Galer: Cheryll Cockayne Other Clinician: Referring Sohum Delillo: Cheryll Cockayne Treating [name]/Extender: Rowan Blase in Treatment: 1 Visit Information History Since Last Visit Added or deleted any medications: No Patient Arrived: Ambulatory Any new allergies or adverse reactions: No Arrival Time: 09:06 Had a fall or experienced change in No Accompanied By: wife activities of daily living that may affect Transfer Assistance: None risk of falls: Patient Identification Verified: Yes Hospitalized since last visit: No Secondary Verification Process Completed: Yes Has Dressing in Place as Prescribed: Yes Patient Requires Transmission-Based Precautions: No Pain Present Now: No Electronic Signature(s) Signed: 11/27/2021 5:12:29 PM By: Angelina Pih Entered By: Angelina Pih on 11/27/2021 09:06:21 [name] ([id number]) -------------------------------------------------------------------------------- Clinic Level of Care Assessment Details Patient Name: [name] Date of Service: 11/27/2021 9:00 AM Medical Record Number: [id number] Patient Account Number: [id number] Date of Birth/Sex: [birth year] (36 y.o. M) Treating RN: Angelina Pih Primary Care Jyaire Koudelka: Cheryll Cockayne Other Clinician: Referring Annel Zunker: Cheryll Cockayne Treating Kilyn Maragh/Extender: Rowan Blase in Treatment: 1 Clinic Level of Care Assessment Items TOOL 4 Quantity Score []  - Use when only an EandM is performed on FOLLOW-UP visit 0 ASSESSMENTS - Nursing Assessment / Reassessment []  - Reassessment of Co-morbidities (includes updates in patient status) 0 []  - 0 Reassessment of Adherence to Treatment  Plan ASSESSMENTS - Wound and Skin Assessment / Reassessment X - Simple Wound Assessment / Reassessment - one wound 1 5 []  - 0 Complex Wound Assessment / Reassessment - multiple wounds []  - 0 Dermatologic / Skin Assessment (not related to wound area) ASSESSMENTS - Focused Assessment []  - Circumferential Edema Measurements - multi extremities 0 []  - 0 Nutritional Assessment / Counseling / Intervention []  - 0 Lower Extremity Assessment (monofilament, tuning fork, pulses) []  - 0 Peripheral Arterial Disease Assessment (using hand held doppler) ASSESSMENTS - Ostomy and/or Continence Assessment and Care []  - Incontinence Assessment and Management 0 []  - 0 Ostomy Care Assessment and Management (repouching, etc.) PROCESS - Coordination of Care X - Simple Patient / Family Education for ongoing care 1 15 []  - 0 Complex (extensive) Patient / Family Education for ongoing care []  - 0 Staff obtains Chiropractor, Records, Test Results / Process Orders []  - 0 Staff telephones HHA, Nursing Homes / Clarify orders / etc []  - 0 Routine Transfer to another Facility (non-emergent condition) []  - 0 Routine Hospital Admission (non-emergent condition) []  - 0 New Admissions / Manufacturing engineer / Ordering NPWT, Apligraf, etc. []  - 0 Emergency Hospital Admission (emergent condition) X- 1 10 Simple Discharge Coordination []  - 0 Complex (extensive) Discharge Coordination PROCESS - Special Needs []  - Pediatric / Minor Patient Management 0 []  - 0 Isolation Patient Management []  - 0 Hearing / Language / Visual special needs []  - 0 Assessment of Community assistance (transportation, D/C planning, etc.) []  - 0 Additional assistance / Altered mentation []  - 0 Support Surface(s) Assessment (bed, cushion, seat, etc.) INTERVENTIONS - Wound Cleansing / Measurement [name], [name] ([id number]) X- 1 5 Simple Wound Cleansing - one wound []  - 0 Complex Wound Cleansing - multiple wounds X- 1 5 Wound  Imaging (photographs - any number of wounds) []  - 0 Wound Tracing (instead of photographs) X- 1 5 Simple Wound Measurement - one wound []  - 0 Complex Wound Measurement - multiple wounds  INTERVENTIONS - Wound Dressings X - Small Wound Dressing one or multiple wounds 1 10 []  - 0 Medium Wound Dressing one or multiple wounds []  - 0 Large Wound Dressing one or multiple wounds []  - 0 Application of Medications - topical []  - 0 Application of Medications - injection INTERVENTIONS - Miscellaneous []  - External ear exam 0 []  - 0 Specimen Collection (cultures, biopsies, blood, body fluids, etc.) []  - 0 Specimen(s) / Culture(s) sent or taken to Lab for analysis []  - 0 Patient Transfer (multiple staff / Lift / Similar devices) []  - 0 Simple Staple / Suture removal (25 or less) []  - 0 Complex Staple / Suture removal (26 or more) []  - 0 Hypo / Hyperglycemic Management (close monitor of Blood Glucose) []  - 0 Ankle / Brachial Index (ABI) - do not check if billed separately X- 1 5 Vital Signs Has the patient been seen at the hospital within the last three years: Yes Total Score: 60 Level Of Care: New/Established - Level 2 Electronic Signature(s) Signed: 11/27/2021 5:12:29 PM By: Entered By: on 11/27/2021 09:50:42 ( ) -------------------------------------------------------------------------------- Encounter Discharge Information Details Patient Name: Date of Service: 11/27/2021 9:00 AM Medical Record Number: Patient Account Number: Date of Birth/Sex: 10/18/1986 (36 y.o. M) Treating RN: 01/25/2022 Primary Care Makyia Erxleben: Angelina Pih Other Clinician: Referring Shaquisha Wynn: Angelina Pih Treating Javier Mamone/Extender: 01/25/2022 in Treatment: 1 Encounter Discharge Information Items Discharge Condition: Stable Ambulatory Status: Ambulatory Discharge Destination: Home Transportation:  Other Accompanied By: wife Schedule Follow-up Appointment: Yes Clinical Summary of Care: Notes Wife provided teaching on how to dress wound Electronic Signature(s) Signed: 11/27/2021 9:52:04 AM By: [id number] Entered By: [name] on 11/27/2021 09:52:04 [id number] ([id number]) -------------------------------------------------------------------------------- Lower Extremity Assessment Details Patient Name: 11/19/1985 Date of Service: 11/27/2021 9:00 AM Medical Record Number: Angelina Pih Patient Account Number: Cheryll Cockayne Date of Birth/Sex: 1986/06/28 (36 y.o. M) Treating RN: 01/25/2022 Primary Care Bailynn Dyk: Angelina Pih Other Clinician: Referring Aniken Monestime: Angelina Pih Treating Shawntelle Ungar/Extender: 01/25/2022 Weeks in Treatment: 1 Electronic Signature(s) Signed: 11/27/2021 5:12:29 PM By: [id number] Entered By: [name] on 11/27/2021 09:13:07 LIBERATO, STANSBERY ([id number]) -------------------------------------------------------------------------------- Multi Wound Chart Details Patient Name: 11/19/1985 Date of Service: 11/27/2021 9:00 AM Medical Record Number: Angelina Pih Patient Account Number: Cheryll Cockayne Date of Birth/Sex: [birth year] (36 y.o. M) Treating RN: 01/25/2022 Primary Care Shaila Gilchrest: Angelina Pih Other Clinician: Referring Quintan Saldivar: Angelina Pih Treating Atara Paterson/Extender: 01/25/2022 in Treatment: 1 Vital Signs Height(in): 67 Pulse(bpm): 73 Weight(lbs): 255 Blood Pressure(mmHg): 150/79 Body Mass Index(BMI): 39.9 Temperature(F): 97.7 Respiratory Rate(breaths/min): 18 Photos: [N/A:N/A] Wound Location: Midline Sacrum N/A N/A Wounding Event: Surgical Injury N/A N/A Primary Etiology: Pilonidal Cyst N/A N/A Date Acquired: 04/13/2021 N/A N/A Weeks of Treatment: 1 N/A N/A Wound Status: Open N/A N/A Wound Recurrence: No N/A N/A Measurements L x W x D (cm) 1.2x0.5x3.6 N/A N/A Area (cm) : 0.471 N/A N/A Volume (cm) : 1.696 N/A  N/A % Reduction in Area: -19.80% N/A N/A % Reduction in Volume: -23.40% N/A N/A Classification: Full Thickness Without Exposed N/A N/A Support Structures Exudate Amount: Medium N/A N/A Exudate Type: Serosanguineous N/A N/A Exudate Color: red, brown N/A N/A Granulation Amount: Large (67-100%) N/A N/A Granulation Quality: Red N/A N/A Necrotic Amount: Small (1-33%) N/A N/A Exposed Structures: Fat Layer (Subcutaneous Tissue): N/A N/A Yes Epithelialization: None N/A N/A Treatment Notes Electronic Signature(s) Signed: 11/27/2021 5:12:29 PM By: [name] Entered By: 01/25/2022 on 11/27/2021 09:20:11 Scheu, Ramses (  [id number]) -------------------------------------------------------------------------------- Multi-Disciplinary Care Plan Details Patient Name: [name], [name] Date of Service: 11/27/2021 9:00 AM Medical Record Number: [id number] Patient Account Number: [id number] Date of Birth/Sex: 08-Jun-1986 (36 y.o. M) Treating RN: Angelina PihGordon, Caitlin Primary Care Zebulan Hinshaw: Cheryll CockayneBurns, Stacy Other Clinician: Referring Arlie Riker: Cheryll CockayneBurns, Stacy Treating Mariavictoria Nottingham/Extender: Rowan BlaseStone, Hoyt Weeks in Treatment: 1 Active Inactive Orientation to the Wound Care Program Nursing Diagnoses: Knowledge deficit related to the wound healing center program Goals: Patient/caregiver will verbalize understanding of the Wound Healing Center Program Date Initiated: 11/20/2021 Target Resolution Date: 11/20/2021 Goal Status: Active Interventions: Provide education on orientation to the wound center Notes: Wound/Skin Impairment Nursing Diagnoses: Impaired tissue integrity Knowledge deficit related to ulceration/compromised skin integrity Goals: Ulcer/skin breakdown will have a volume reduction of 30% by week 4 Date Initiated: 11/20/2021 Target Resolution Date: 12/18/2021 Goal Status: Active Ulcer/skin breakdown will have a volume reduction of 50% by week 8 Date Initiated: 11/20/2021 Target Resolution Date:  01/15/2022 Goal Status: Active Ulcer/skin breakdown will have a volume reduction of 80% by week 12 Date Initiated: 11/20/2021 Target Resolution Date: 02/12/2022 Goal Status: Active Ulcer/skin breakdown will heal within 14 weeks Date Initiated: 11/20/2021 Target Resolution Date: 02/26/2022 Goal Status: Active Interventions: Assess patient/caregiver ability to obtain necessary supplies Assess patient/caregiver ability to perform ulcer/skin care regimen upon admission and as needed Assess ulceration(s) every visit Provide education on ulcer and skin care Notes: Electronic Signature(s) Signed: 11/27/2021 5:12:29 PM By: Angelina PihGordon, Caitlin Entered By: Angelina PihGordon, Caitlin on 11/27/2021 09:19:59 [name], [name] ([id number]) -------------------------------------------------------------------------------- Pain Assessment Details Patient Name: [name], [name] Date of Service: 11/27/2021 9:00 AM Medical Record Number: [id number] Patient Account Number: [id number] Date of Birth/Sex: 08-Jun-1986 (36 y.o. M) Treating RN: Angelina PihGordon, Caitlin Primary Care Neftali Thurow: Cheryll CockayneBurns, Stacy Other Clinician: Referring Hermela Hardt: Cheryll CockayneBurns, Stacy Treating Lorriane Dehart/Extender: Rowan BlaseStone, Hoyt Weeks in Treatment: 1 Active Problems Location of Pain Severity and Description of Pain Patient Has Paino No Site Locations Rate the pain. Current Pain Level: 0 Pain Management and Medication Current Pain Management: Electronic Signature(s) Signed: 11/27/2021 5:12:29 PM By: Angelina PihGordon, Caitlin Entered By: Angelina PihGordon, Caitlin on 11/27/2021 09:06:47 [name], [name] ([id number]) -------------------------------------------------------------------------------- Patient/Caregiver Education Details Patient Name: [name], [name] Date of Service: 11/27/2021 9:00 AM Medical Record Number: [id number] Patient Account Number: [id number] Date of Birth/Gender: 08-Jun-1986 (36 y.o. M) Treating RN: Angelina PihGordon, Caitlin Primary Care Physician: Cheryll CockayneBurns, Stacy Other Clinician: Referring  Physician: Cheryll CockayneBurns, Stacy Treating Physician/Extender: Rowan BlaseStone, Hoyt Weeks in Treatment: 1 Education Assessment Education Provided To: Patient and Caregiver Education Topics Provided Welcome To The Wound Care Center: Wound/Skin Impairment: Handouts: Caring for Your Ulcer Methods: Explain/Verbal Responses: State content correctly Electronic Signature(s) Signed: 11/27/2021 5:12:29 PM By: Angelina PihGordon, Caitlin Entered By: Angelina PihGordon, Caitlin on 11/27/2021 09:51:10 [name], [name] ([id number]) -------------------------------------------------------------------------------- Wound Assessment Details Patient Name: [name], [name] Date of Service: 11/27/2021 9:00 AM Medical Record Number: [id number] Patient Account Number: [id number] Date of Birth/Sex: 08-Jun-1986 (36 y.o. M) Treating RN: Angelina PihGordon, Caitlin Primary Care Logon [employer]: Cheryll CockayneBurns, Stacy Other Clinician: Referring Shaquavia Whisonant: Cheryll CockayneBurns, Stacy Treating Creta Dorame/Extender: Allen DerryStone, Hoyt Weeks in Treatment: 1 Wound Status Wound Number: 1 Primary Etiology: Pilonidal Cyst Wound Location: Midline Sacrum Wound Status: Open Wounding Event: Surgical Injury Date Acquired: 04/13/2021 Weeks Of Treatment: 1 Clustered Wound: No Photos Wound Measurements Length: (cm) 1.2 Width: (cm) 0.5 Depth: (cm) 3.6 Area: (cm) 0.471 Volume: (cm) 1.696 % Reduction in Area: -19.8% % Reduction in Volume: -23.4% Epithelialization: None Tunneling: No Undermining: No Wound Description Classification: Full Thickness Without Exposed Support Structures Exudate Amount: Medium Exudate Type: Serosanguineous Exudate Color: red, brown Foul Odor After Cleansing: No Slough/Fibrino Yes  Wound Bed Granulation Amount: Large (67-100%) Exposed Structure Granulation Quality: Red Fat Layer (Subcutaneous Tissue) Exposed: Yes Necrotic Amount: Small (1-33%) Necrotic Quality: Adherent Slough Treatment Notes Wound #1 (Sacrum) Wound Laterality: Midline Cleanser Peri-Wound Care Topical Primary  Dressing Hydrofera Blue Classic Foam Rope Dressing, 9x6 (mm/in) Discharge Instruction: rope needs to be cut in half to fit wound Yung, Wilton ([id number]) Secondary Dressing Gauze Discharge Instruction: As directed: dry, small 2x2 gauze, then large 4x4 gauze over Telfa Adhesive [employer], 4x4 (in/in) Discharge Instruction: Apply over dressing to secure in place. Secured With 31M Medipore H Soft Cloth Surgical Tape, 2x2 (in/yd) Compression Wrap Compression Stockings Add-Ons Electronic Signature(s) Signed: 11/27/2021 5:12:29 PM By: Angelina Pih Entered By: Angelina Pih on 11/27/2021 09:12:49 LISSANDRO, DILORENZO ([id number]) -------------------------------------------------------------------------------- Vitals Details Patient Name: [name] Date of Service: 11/27/2021 9:00 AM Medical Record Number: [id number] Patient Account Number: [id number] Date of Birth/Sex: Mar 03, 1986 (36 y.o. M) Treating RN: Angelina Pih Primary Care Chantell Kunkler: Cheryll Cockayne Other Clinician: Referring Derrius Furtick: Cheryll Cockayne Treating Corbyn Steedman/Extender: Rowan Blase in Treatment: 1 Vital Signs Time Taken: 09:05 Temperature (F): 97.7 Height (in): 67 Pulse (bpm): 73 Weight (lbs): 255 Respiratory Rate (breaths/min): 18 Body Mass Index (BMI): 39.9 Blood Pressure (mmHg): 150/79 Reference Range: 80 - 120 mg / dl Electronic Signature(s) Signed: 11/27/2021 5:12:29 PM By: Angelina Pih Entered By: Angelina Pih on 11/27/2021 09:06:37

## 2021-11-27 NOTE — Progress Notes (Addendum)
EXODUS, KUTZER ([id number]) Visit Report for 11/27/2021 Chief Complaint Document Details Patient Name: [name], [name] Date of Service: 11/27/2021 9:00 AM Medical Record Number: [id number] Patient Account Number: [id number] Date of Birth/Sex: 07-31-1986 (36 y.o. M) Treating RN: Angelina Pih Primary Care Provider: Cheryll Cockayne Other Clinician: Referring Provider: Cheryll Cockayne Treating Provider/Extender: Rowan Blase in Treatment: 1 Information Obtained from: Patient Chief Complaint Disruption of pilonidal cyst removal Electronic Signature(s) Signed: 11/27/2021 9:11:37 AM By: Lenda Kelp PA-C Entered By: Lenda Kelp on 11/27/2021 09:11:37 [place], Jahkai ([id number]) -------------------------------------------------------------------------------- HPI Details Patient Name: [name] Date of Service: 11/27/2021 9:00 AM Medical Record Number: [id number] Patient Account Number: [id number] Date of Birth/Sex: [birth year] (36 y.o. M) Treating RN: Angelina Pih Primary Care Provider: Cheryll Cockayne Other Clinician: Referring Provider: Cheryll Cockayne Treating Provider/Extender: Rowan Blase in Treatment: 1 History of Present Illness HPI Description: 11/20/2021 upon evaluation patient presents for initial inspection here in our clinic concerning a history of having had a pilonidal cyst summer 2022. Following that time he was told that it could take up to 6 months for this to heal therefore he is waited until now which was the 17-month mark to come in to be seen here at the wound care center. Unfortunately I am just not certain that this has been healing as well from an internal perspective it is what should be and to be honest it still draining but again there is significant depth to the wound that really has not been packed appropriately up to this point. I do believe that we do need to address that today. The good news is he is otherwise fairly healthy there is no signs of systemic  infection and locally the infection seems to be pretty well controlled at this time. I do not see significant purulent drainage although I do see quite a bit of drainage in the internal aspect of this is very friable currently. 11/27/2021 upon evaluation today patient appears to be doing about the same in regard to his wound. Fortunately I do not see anything that seems to be worse or truly infected but unfortunately he still is very friable and having a lot of bleeding even with just light cleaning using a Q-tip and gauze. 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 He does have some what appears to be potentially swelling although it may just be scarring to the side on the right of the gluteal region just lateral to the sacrum. His wife who is here today states that she feels like that has been that way since the surgery but she cannot be 100% sure. Nonetheless in general I am concerned about the fact that this probably needs to be changed more frequently I think even daily and they are getting start doing this at home on their own when she is good to help him. I do believe we may want to see about an ultrasound however to see if there is any evidence of an ongoing pilonidal cyst here. Electronic Signature(s) Signed: 11/27/2021 11:35:07 AM By: Lenda Kelp PA-C Entered By: Lenda Kelp on 11/27/2021 11:35:07 [name], [name] ([id number]) -------------------------------------------------------------------------------- Physical Exam Details Patient Name: [name] Date of Service: 11/27/2021 9:00 AM Medical Record Number: [id number] Patient Account Number: [id number] Date of Birth/Sex: 23-May-1986 (36 y.o. M) Treating RN: Angelina Pih Primary Care Provider: Cheryll Cockayne Other Clinician: Referring Provider: Cheryll Cockayne Treating Provider/Extender: Allen Derry Weeks in Treatment: 1 Constitutional  patient is  hypertensive.. Well-nourished and well-hydrated in no acute distress. Respiratory normal breathing without difficulty. Psychiatric this patient is able to make decisions and demonstrates good insight into disease process. Alert and Oriented x 3. pleasant and cooperative. Notes Upon evaluation patient still has significant depth in regards to his wound. Fortunately I do not see any signs of overt infection and in fact it appears to be a little cleaner from what was seen drainage wise compared to when I first saw him. But that being said it is still extremely friable which is not good. Electronic Signature(s) Signed: 11/27/2021 11:35:36 AM By: Lenda Kelp PA-C Entered By: Lenda Kelp on 11/27/2021 11:35:36 [place], Collie ([id number]) -------------------------------------------------------------------------------- Physician Orders Details Patient Name: [name] Date of Service: 11/27/2021 9:00 AM Medical Record Number: [id number] Patient Account Number: [id number] Date of Birth/Sex: 03-Nov-1985 (36 y.o. M) Treating RN: Angelina Pih Primary Care Provider: Cheryll Cockayne Other Clinician: Referring Provider: Cheryll Cockayne Treating Provider/Extender: Rowan Blase in Treatment: 1 Verbal / Phone Orders: No Diagnosis Coding ICD-10 Coding Code Description T81.31XA Disruption of external operation (surgical) wound, not elsewhere classified, initial encounter L98.492 Non-pressure chronic ulcer of skin of other sites with fat layer exposed L05.01 Pilonidal cyst with abscess Follow-up Appointments o Return Appointment in 1 week. o Nurse Visit as needed Bathing/ Shower/ Hygiene o May shower; gently cleanse wound with antibacterial soap, rinse and pat dry prior to dressing wounds - shower on days coming in to have dressing change o No tub bath. Anesthetic (Use 'Patient Medications' Section for Anesthetic Order Entry) o Lidocaine applied to wound bed Wound  Treatment Wound #1 - Sacrum Wound Laterality: Midline Primary Dressing: Hydrofera Blue Classic Foam Rope Dressing, 9x6 (mm/in) 1 x Per Day/30 Days Discharge Instructions: rope needs to be cut in half to fit wound Secondary Dressing: Gauze 1 x Per Day/30 Days Discharge Instructions: As directed: dry, small 2x2 gauze, then large 4x4 gauze over Secondary Dressing: Telfa Adhesive Island Dressing, 4x4 (in/in) 1 x Per Day/30 Days Discharge Instructions: Apply over dressing to secure in place. Secured With: 54M Medipore H Soft Cloth Surgical Tape, 2x2 (in/yd) 1 x Per Day/30 Days Services and Therapies o Other Services or Procedures - ultra sound to sacrum area post pilonidal cyst - (ICD10 L05.01 - Pilonidal cyst with abscess) Electronic Signature(s) Signed: 11/27/2021 9:50:00 AM By: Angelina Pih Signed: 11/28/2021 10:02:31 AM By: Lenda Kelp PA-C Previous Signature: 11/27/2021 9:44:48 AM Version By: Hansel Feinstein Entered By: Angelina Pih on 11/27/2021 09:50:00 [place], Makenzie ([id number]) -------------------------------------------------------------------------------- Prescription 11/27/2021 Patient Name: [name] Provider: Allen Derry PA-C Date of Birth: [birth year] NPI#: [id number] Sex: M DEA#: [id number] Phone #: [phone] License #: Patient Address: [facility] [address] [facility] [place], [place] [address] [address] [place], [place] [address] [phone] Allergies No Known Drug Allergies Provider's Orders o Other Services or Procedures - ICD10: L05.01 - ultra sound to sacrum area post pilonidal cyst Hand Signature: Date(s): Electronic Signature(s) Signed: 11/27/2021 5:12:29 PM By: Angelina Pih Signed: 11/28/2021 10:02:31 AM By: Lenda Kelp PA-C Entered By: Angelina Pih on 11/27/2021 09:50:00 [name]  ([id number]) --------------------------------------------------------------------------------  Problem List Details Patient Name: [name] Date of Service: 11/27/2021 9:00 AM Medical Record Number: [id number] Patient Account Number: [id number] Date of Birth/Sex: 1985-12-02 (36 y.o. M) Treating RN: Angelina Pih Primary Care Provider: Cheryll Cockayne Other Clinician: Referring Provider: Cheryll Cockayne Treating Provider/Extender: Rowan Blase in Treatment: 1 Active Problems  ICD-10 Encounter Code Description Active Date MDM Diagnosis T81.31XA Disruption of external operation (surgical) wound, not elsewhere 11/20/2021 No Yes classified, initial encounter L98.492 Non-pressure chronic ulcer of skin of other sites with fat layer exposed 11/20/2021 No Yes L05.01 Pilonidal cyst with abscess 11/20/2021 No Yes Inactive Problems Resolved Problems Electronic Signature(s) Signed: 11/27/2021 9:11:25 AM By: Lenda KelpStone III, Arbor Leer PA-C Entered By: Lenda KelpStone III, Dhamar Gregory on 11/27/2021 09:11:25 [name] ShearerOLLINS, Trong ([id number]) -------------------------------------------------------------------------------- Progress Note Details Patient Name: [name] ShearerOLLINS, Deandrea Date of Service: 11/27/2021 9:00 AM Medical Record Number: [id number] Patient Account Number: [id number] Date of Birth/Sex: September 30, 1986 (36 y.o. M) Treating RN: Angelina PihGordon, Caitlin Primary Care Provider: Cheryll CockayneBurns, Stacy Other Clinician: Referring Provider: Cheryll CockayneBurns, Stacy Treating Provider/Extender: Rowan BlaseStone, Bricelyn Freestone Weeks in Treatment: 1 Subjective Chief Complaint Information obtained from Patient Disruption of pilonidal cyst removal History of Present Illness (HPI) 11/20/2021 upon evaluation patient presents for initial inspection here in our clinic concerning a history of having had a pilonidal cyst summer 2022. Following that time he was told that it could take up to 6 months for this to heal therefore he is waited until now which was the 2951-month mark to come in to be seen  here at the wound care center. Unfortunately I am just not certain that this has been healing as well from an internal perspective it is what should be and to be honest it still draining but again there is significant depth to the wound that really has not been packed appropriately up to this point. I do believe that we do need to address that today. The good news is he is otherwise fairly healthy there is no signs of systemic infection and locally the infection seems to be pretty well controlled at this time. I do not see significant purulent drainage although I do see quite a bit of drainage in the internal aspect of this is very friable currently. 11/27/2021 upon evaluation today patient appears to be doing about the same in regard to his wound. Fortunately I do not see anything that seems to be worse or truly infected but unfortunately he still is very friable and having a lot of bleeding even with just light cleaning using a Q-tip and gauze. 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 He does have some what appears to be potentially swelling although it may just be scarring to the side on the right of the gluteal region just lateral to the sacrum. His wife who is here today states that she feels like that has been that way since the surgery but she cannot be 100% sure. Nonetheless in general I am concerned about the fact that this probably needs to be changed more frequently I think even daily and they are getting start doing this at home on their own when she is good to help him. I do believe we may want to see about an ultrasound however to see if there is any evidence of an ongoing pilonidal cyst here. Objective Constitutional patient is hypertensive.. Well-nourished and well-hydrated in no acute distress. Vitals Time Taken: 9:05 AM, Height: 67 in, Weight: 255 lbs, BMI: 39.9, Temperature: 97.7 F, Pulse: 73 bpm,  Respiratory Rate: 18 breaths/min, Blood Pressure: 150/79 mmHg. Respiratory normal breathing without difficulty. Psychiatric this patient is able to make decisions and demonstrates good insight into disease process. Alert and Oriented x 3. pleasant and cooperative. General Notes: Upon evaluation patient still has significant depth in regards to his wound.  Fortunately I do not see any signs of overt infection and in fact it appears to be a little cleaner from what was seen drainage wise compared to when I first saw him. But that being said it is still extremely friable which is not good. Integumentary (Hair, Skin) Wound #1 status is Open. Original cause of wound was Surgical Injury. The date acquired was: 04/13/2021. The wound has been in treatment 1 weeks. The wound is located on the Midline Sacrum. The wound measures 1.2cm length x 0.5cm width x 3.6cm depth; 0.471cm^2 area and 1.696cm^3 volume. There is Fat Layer (Subcutaneous Tissue) exposed. There is no tunneling or undermining noted. There is a medium amount of serosanguineous drainage noted. There is large (67-100%) red granulation within the wound bed. There is a small (1-33%) amount of necrotic tissue within the wound bed including Adherent Slough. Assessment [name] ShearerCOLLINS, Logen ([id number]) Active Problems ICD-10 Disruption of external operation (surgical) wound, not elsewhere classified, initial encounter Non-pressure chronic ulcer of skin of other sites with fat layer exposed Pilonidal cyst with abscess Plan Follow-up Appointments: Return Appointment in 1 week. Nurse Visit as needed Bathing/ Shower/ Hygiene: May shower; gently cleanse wound with antibacterial soap, rinse and pat dry prior to dressing wounds - shower on days coming in to have dressing change No tub bath. Anesthetic (Use 'Patient Medications' Section for Anesthetic Order Entry): Lidocaine applied to wound bed Services and Therapies ordered were: Other Services or  Procedures - ultra sound to sacrum area post pilonidal cyst WOUND #1: - Sacrum Wound Laterality: Midline Primary Dressing: Hydrofera Blue Classic Foam Rope Dressing, 9x6 (mm/in) 1 x Per Day/30 Days Discharge Instructions: rope needs to be cut in half to fit wound Secondary Dressing: Gauze 1 x Per Day/30 Days Discharge Instructions: As directed: dry, small 2x2 gauze, then large 4x4 gauze over Secondary Dressing: Telfa Adhesive Island Dressing, 4x4 (in/in) 1 x Per Day/30 Days Discharge Instructions: Apply over dressing to secure in place. Secured With: 41M Medipore H Soft Cloth Surgical Tape, 2x2 (in/yd) 1 x Per Day/30 Days 1. Based on the current physical exam findings my suggestion is good to be that we go ahead and see about getting the patient set up for an ultrasound of the sacral/gluteal region to see if there is any evidence of an ongoing pilonidal cyst. We will See about getting that scheduled for him as it is possible. 2. I am also going to recommend at this point that we have the patient continue with the Hydrofera Blue rope cut in half. I did show his wife how to do that today. They will be changing this daily. 3. I am also going to recommend continuing with the Telfa island dressing after use of the gauze just to pad the area. We will see patient back for reevaluation in 1 week here in the clinic. If anything worsens or changes patient will contact our office for additional recommendations. We will see what the results of the ultrasound show and make any adjustments in care as necessary following. Electronic Signature(s) Signed: 11/27/2021 11:37:07 AM By: Lenda KelpStone III, Annie Roseboom PA-C Entered By: Lenda KelpStone III, Shykeem Resurreccion on 11/27/2021 11:37:06 [place], [place] ([id number]) -------------------------------------------------------------------------------- SuperBill Details Patient Name: [name] ShearerOLLINS, Argelio Date of Service: 11/27/2021 Medical Record Number: [id number] Patient Account Number: [id number] Date of  Birth/Sex: [birth year] (36 y.o. M) Treating RN: Angelina PihGordon, Caitlin Primary Care Provider: Cheryll CockayneBurns, Stacy Other Clinician: Referring Provider: Cheryll CockayneBurns, Stacy Treating Provider/Extender: Rowan BlaseStone, Marsel Gail Weeks in Treatment: 1 Diagnosis Coding ICD-10 Codes Code Description T81.31XA Disruption of external  operation (surgical) wound, not elsewhere classified, initial encounter L98.492 Non-pressure chronic ulcer of skin of other sites with fat layer exposed L05.01 Pilonidal cyst with abscess Facility Procedures CPT4 Code: [id number] Description: [phone] - WOUND CARE VISIT-LEV 2 EST PT Modifier: Quantity: 1 Physician Procedures CPT4 Code: [id number] Description: 99214 - WC PHYS LEVEL 4 - EST PT Modifier: Quantity: 1 CPT4 Code: Description: ICD-10 Diagnosis Description T81.31XA Disruption of external operation (surgical) wound, not elsewhere classifi L98.492 Non-pressure chronic ulcer of skin of other sites with fat layer exposed L05.01 Pilonidal cyst with abscess Modifier: ed, initial encounter Quantity: Electronic Signature(s) Signed: 11/27/2021 11:37:22 AM By: Lenda Kelp PA-C Previous Signature: 11/27/2021 9:50:51 AM Version By: Angelina Pih Entered By: Lenda Kelp on 11/27/2021 11:37:22

## 2021-11-28 ENCOUNTER — Other Ambulatory Visit: Payer: Self-pay | Admitting: Physician Assistant

## 2021-11-28 DIAGNOSIS — L0501 Pilonidal cyst with abscess: Secondary | ICD-10-CM

## 2021-11-29 ENCOUNTER — Ambulatory Visit: Payer: BC Managed Care – PPO

## 2021-12-01 ENCOUNTER — Ambulatory Visit: Payer: BC Managed Care – PPO

## 2021-12-04 ENCOUNTER — Other Ambulatory Visit: Payer: Self-pay

## 2021-12-04 ENCOUNTER — Encounter: Payer: BC Managed Care – PPO | Admitting: Physician Assistant

## 2021-12-04 DIAGNOSIS — T8131XA Disruption of external operation (surgical) wound, not elsewhere classified, initial encounter: Secondary | ICD-10-CM | POA: Diagnosis not present

## 2021-12-04 DIAGNOSIS — L0501 Pilonidal cyst with abscess: Secondary | ICD-10-CM | POA: Diagnosis not present

## 2021-12-04 DIAGNOSIS — Y838 Other surgical procedures as the cause of abnormal reaction of the patient, or of later complication, without mention of misadventure at the time of the procedure: Secondary | ICD-10-CM | POA: Diagnosis not present

## 2021-12-04 DIAGNOSIS — L98492 Non-pressure chronic ulcer of skin of other sites with fat layer exposed: Secondary | ICD-10-CM | POA: Diagnosis not present

## 2021-12-04 NOTE — Progress Notes (Signed)
[name], [name] ([id number]) Visit Report for 12/04/2021 Arrival Information Details Patient Name: [name], [name] Date of Service: 12/04/2021 11:15 AM Medical Record Number: [id number] Patient Account Number: [id number] Date of Birth/Sex: 02-14-1986 (36 y.o. M) Treating RN: Levora Dredge Primary Care Pamella Samons: Billey Gosling Other Clinician: Referring Aaric Dolph: Billey Gosling Treating Jakita Dutkiewicz/Extender: Skipper Cliche in Treatment: 2 Visit Information History Since Last Visit Added or deleted any medications: No Patient Arrived: Ambulatory Any new allergies or adverse reactions: No Arrival Time: 11:26 Had a fall or experienced change in No Accompanied By: self activities of daily living that may affect Transfer Assistance: None risk of falls: Patient Identification Verified: Yes Hospitalized since last visit: No Secondary Verification Process Completed: Yes Has Dressing in Place as Prescribed: Yes Patient Requires Transmission-Based Precautions: No Pain Present Now: No Electronic Signature(s) Signed: 12/04/2021 4:20:39 PM By: Levora Dredge Entered By: Levora Dredge on 12/04/2021 11:26:42 [name] ([id number]) -------------------------------------------------------------------------------- Clinic Level of Care Assessment Details Patient Name: [name] Date of Service: 12/04/2021 11:15 AM Medical Record Number: [id number] Patient Account Number: [id number] Date of Birth/Sex: [birth year] (36 y.o. M) Treating RN: Levora Dredge Primary Care Davionne Mastrangelo: Billey Gosling Other Clinician: Referring Avion Patella: Billey Gosling Treating Shiya Fogelman/Extender: Skipper Cliche in Treatment: 2 Clinic Level of Care Assessment Items TOOL 4 Quantity Score []  - Use when only an EandM is performed on FOLLOW-UP visit 0 ASSESSMENTS - Nursing Assessment / Reassessment []  - Reassessment of Co-morbidities (includes updates in patient status) 0 []  - 0 Reassessment of Adherence to Treatment  Plan ASSESSMENTS - Wound and Skin Assessment / Reassessment X - Simple Wound Assessment / Reassessment - one wound 1 5 []  - 0 Complex Wound Assessment / Reassessment - multiple wounds []  - 0 Dermatologic / Skin Assessment (not related to wound area) ASSESSMENTS - Focused Assessment []  - Circumferential Edema Measurements - multi extremities 0 []  - 0 Nutritional Assessment / Counseling / Intervention []  - 0 Lower Extremity Assessment (monofilament, tuning fork, pulses) []  - 0 Peripheral Arterial Disease Assessment (using hand held doppler) ASSESSMENTS - Ostomy and/or Continence Assessment and Care []  - Incontinence Assessment and Management 0 []  - 0 Ostomy Care Assessment and Management (repouching, etc.) PROCESS - Coordination of Care X - Simple Patient / Family Education for ongoing care 1 15 []  - 0 Complex (extensive) Patient / Family Education for ongoing care []  - 0 Staff obtains Programmer, systems, Records, Test Results / Process Orders []  - 0 Staff telephones HHA, Nursing Homes / Clarify orders / etc []  - 0 Routine Transfer to another Facility (non-emergent condition) []  - 0 Routine Hospital Admission (non-emergent condition) []  - 0 New Admissions / Biomedical engineer / Ordering NPWT, Apligraf, etc. []  - 0 Emergency Hospital Admission (emergent condition) X- 1 10 Simple Discharge Coordination []  - 0 Complex (extensive) Discharge Coordination PROCESS - Special Needs []  - Pediatric / Minor Patient Management 0 []  - 0 Isolation Patient Management []  - 0 Hearing / Language / Visual special needs []  - 0 Assessment of Community assistance (transportation, D/C planning, etc.) []  - 0 Additional assistance / Altered mentation []  - 0 Support Surface(s) Assessment (bed, cushion, seat, etc.) INTERVENTIONS - Wound Cleansing / Measurement [name], [name] ([id number]) X- 1 5 Simple Wound Cleansing - one wound []  - 0 Complex Wound Cleansing - multiple wounds X- 1 5 Wound  Imaging (photographs - any number of wounds) []  - 0 Wound Tracing (instead of photographs) X- 1 5 Simple Wound Measurement - one wound []  - 0 Complex Wound Measurement - multiple wounds  INTERVENTIONS - Wound Dressings X - Small Wound Dressing one or multiple wounds 1 10 []  - 0 Medium Wound Dressing one or multiple wounds []  - 0 Large Wound Dressing one or multiple wounds []  - 0 Application of Medications - topical []  - 0 Application of Medications - injection INTERVENTIONS - Miscellaneous []  - External ear exam 0 []  - 0 Specimen Collection (cultures, biopsies, blood, body fluids, etc.) []  - 0 Specimen(s) / Culture(s) sent or taken to Lab for analysis []  - 0 Patient Transfer (multiple staff / Civil Service fast streamer / Similar devices) []  - 0 Simple Staple / Suture removal (25 or less) []  - 0 Complex Staple / Suture removal (26 or more) []  - 0 Hypo / Hyperglycemic Management (close monitor of Blood Glucose) []  - 0 Ankle / Brachial Index (ABI) - do not check if billed separately X- 1 5 Vital Signs Has the patient been seen at the hospital within the last three years: Yes Total Score: 60 Level Of Care: New/Established - Level 2 Electronic Signature(s) Signed: 12/04/2021 4:20:39 PM By: Levora Dredge Entered By: Levora Dredge on 12/04/2021 12:10:46 [name] ([id number]) -------------------------------------------------------------------------------- Encounter Discharge Information Details Patient Name: [name] Date of Service: 12/04/2021 11:15 AM Medical Record Number: [id number] Patient Account Number: [id number] Date of Birth/Sex: September 14, 1986 (36 y.o. M) Treating RN: Levora Dredge Primary Care Diyana Starrett: Billey Gosling Other Clinician: Referring Dawud Mays: Billey Gosling Treating Nakota Elsen/Extender: Skipper Cliche in Treatment: 2 Encounter Discharge Information Items Discharge Condition: Stable Ambulatory Status: Ambulatory Discharge Destination:  Home Transportation: Private Auto Accompanied By: self Schedule Follow-up Appointment: Yes Clinical Summary of Care: Electronic Signature(s) Signed: 12/04/2021 4:20:39 PM By: Levora Dredge Entered By: Levora Dredge on 12/04/2021 12:12:22 [name] ([id number]) -------------------------------------------------------------------------------- Lower Extremity Assessment Details Patient Name: [name] Date of Service: 12/04/2021 11:15 AM Medical Record Number: [id number] Patient Account Number: [id number] Date of Birth/Sex: 1986-10-02 (36 y.o. M) Treating RN: Levora Dredge Primary Care Faust Thorington: Billey Gosling Other Clinician: Referring Xaiden Fleig: Billey Gosling Treating Jaydan Chretien/Extender: Jeri Cos Weeks in Treatment: 2 Electronic Signature(s) Signed: 12/04/2021 4:20:39 PM By: Levora Dredge Entered By: Levora Dredge on 12/04/2021 11:35:12 [name], [name] ([id number]) -------------------------------------------------------------------------------- Multi Wound Chart Details Patient Name: [name] Date of Service: 12/04/2021 11:15 AM Medical Record Number: [id number] Patient Account Number: [id number] Date of Birth/Sex: 1986/10/17 (36 y.o. M) Treating RN: Levora Dredge Primary Care Chett Taniguchi: Billey Gosling Other Clinician: Referring Avrianna Smart: Billey Gosling Treating Yarexi Pawlicki/Extender: Skipper Cliche in Treatment: 2 Vital Signs Height(in): 67 Pulse(bpm): 31 Weight(lbs): 255 Blood Pressure(mmHg): 145/81 Body Mass Index(BMI): 39.9 Temperature(F): 97.5 Respiratory Rate(breaths/min): 18 Photos: [N/A:N/A] Wound Location: Midline Sacrum N/A N/A Wounding Event: Surgical Injury N/A N/A Primary Etiology: Pilonidal Cyst N/A N/A Date Acquired: 04/13/2021 N/A N/A Weeks of Treatment: 2 N/A N/A Wound Status: Open N/A N/A Wound Recurrence: No N/A N/A Measurements L x W x D (cm) 2.7x0.5x3.5 N/A N/A Area (cm) : 1.06 N/A N/A Volume (cm) : 3.711 N/A N/A % Reduction in  Area: -169.70% N/A N/A % Reduction in Volume: -170.10% N/A N/A Classification: Full Thickness Without Exposed N/A N/A Support Structures Exudate Amount: Medium N/A N/A Exudate Type: Serosanguineous N/A N/A Exudate Color: red, brown N/A N/A Granulation Amount: Large (67-100%) N/A N/A Granulation Quality: Red N/A N/A Necrotic Amount: Small (1-33%) N/A N/A Exposed Structures: Fat Layer (Subcutaneous Tissue): N/A N/A Yes Epithelialization: None N/A N/A Treatment Notes Electronic Signature(s) Signed: 12/04/2021 4:20:39 PM By: Levora Dredge Entered By: Levora Dredge on 12/04/2021 11:55:49 [name] ([id number]) -------------------------------------------------------------------------------- Multi-Disciplinary Care Plan Details Patient Name:  [name] Date of Service: 12/04/2021 11:15 AM Medical Record Number: [id number] Patient Account Number: [id number] Date of Birth/Sex: 1986/03/12 (36 y.o. M) Treating RN: Levora Dredge Primary Care Ezana Hubbert: Billey Gosling Other Clinician: Referring Latrell Reitan: Billey Gosling Treating Nailani Full/Extender: Skipper Cliche in Treatment: 2 Active Inactive Orientation to the Wound Care Program Nursing Diagnoses: Knowledge deficit related to the wound healing center program Goals: Patient/caregiver will verbalize understanding of the [facility] Program Date Initiated: 11/20/2021 Target Resolution Date: 11/20/2021 Goal Status: Active Interventions: Provide education on orientation to the wound center Notes: Wound/Skin Impairment Nursing Diagnoses: Impaired tissue integrity Knowledge deficit related to ulceration/compromised skin integrity Goals: Ulcer/skin breakdown will have a volume reduction of 30% by week 4 Date Initiated: 11/20/2021 Target Resolution Date: 12/18/2021 Goal Status: Active Ulcer/skin breakdown will have a volume reduction of 50% by week 8 Date Initiated: 11/20/2021 Target Resolution Date: 01/15/2022 Goal  Status: Active Ulcer/skin breakdown will have a volume reduction of 80% by week 12 Date Initiated: 11/20/2021 Target Resolution Date: 02/12/2022 Goal Status: Active Ulcer/skin breakdown will heal within 14 weeks Date Initiated: 11/20/2021 Target Resolution Date: 02/26/2022 Goal Status: Active Interventions: Assess patient/caregiver ability to obtain necessary supplies Assess patient/caregiver ability to perform ulcer/skin care regimen upon admission and as needed Assess ulceration(s) every visit Provide education on ulcer and skin care Notes: Electronic Signature(s) Signed: 12/04/2021 4:20:39 PM By: Levora Dredge Entered By: Levora Dredge on 12/04/2021 11:55:34 [name] ([id number]) -------------------------------------------------------------------------------- Pain Assessment Details Patient Name: [name] Date of Service: 12/04/2021 11:15 AM Medical Record Number: [id number] Patient Account Number: [id number] Date of Birth/Sex: 15-Jun-1986 (36 y.o. M) Treating RN: Levora Dredge Primary Care Amariyah Bazar: Billey Gosling Other Clinician: Referring Sajad Glander: Billey Gosling Treating Tondra Reierson/Extender: Skipper Cliche in Treatment: 2 Active Problems Location of Pain Severity and Description of Pain Patient Has Paino No Site Locations Rate the pain. Current Pain Level: 0 Pain Management and Medication Current Pain Management: Electronic Signature(s) Signed: 12/04/2021 4:20:39 PM By: Levora Dredge Entered By: Levora Dredge on 12/04/2021 11:27:22 [name], [name] ([id number]) -------------------------------------------------------------------------------- Patient/Caregiver Education Details Patient Name: [name] Date of Service: 12/04/2021 11:15 AM Medical Record Number: [id number] Patient Account Number: [id number] Date of Birth/Gender: 1985-12-23 (36 y.o. M) Treating RN: Levora Dredge Primary Care Physician: Billey Gosling Other Clinician: Referring Physician:  Billey Gosling Treating Physician/Extender: Skipper Cliche in Treatment: 2 Education Assessment Education Provided To: Patient Education Topics Provided Wound/Skin Impairment: Handouts: Caring for Your Ulcer Methods: Explain/Verbal Responses: State content correctly Electronic Signature(s) Signed: 12/04/2021 4:20:39 PM By: Levora Dredge Entered By: Levora Dredge on 12/04/2021 12:11:04 [name] ([id number]) -------------------------------------------------------------------------------- Wound Assessment Details Patient Name: [name] Date of Service: 12/04/2021 11:15 AM Medical Record Number: [id number] Patient Account Number: [id number] Date of Birth/Sex: [birth year] (36 y.o. M) Treating RN: Levora Dredge Primary Care Bonnye Halle: Billey Gosling Other Clinician: Referring Deivi Huckins: Billey Gosling Treating Yang Rack/Extender: Jeri Cos Weeks in Treatment: 2 Wound Status Wound Number: 1 Primary Etiology: Pilonidal Cyst Wound Location: Midline Sacrum Wound Status: Open Wounding Event: Surgical Injury Date Acquired: 04/13/2021 Weeks Of Treatment: 2 Clustered Wound: No Photos Wound Measurements Length: (cm) 2.7 Width: (cm) 0.5 Depth: (cm) 3.5 Area: (cm) 1.06 Volume: (cm) 3.711 % Reduction in Area: -169.7% % Reduction in Volume: -170.1% Epithelialization: None Tunneling: No Undermining: No Wound Description Classification: Full Thickness Without Exposed Support Structu Exudate Amount: Medium Exudate Type: Serosanguineous Exudate Color: red, brown res Foul Odor After Cleansing: No Slough/Fibrino Yes Wound Bed Granulation Amount: Large (67-100%) Exposed Structure Granulation Quality: Red Fat Layer (Subcutaneous Tissue)  Exposed: Yes Necrotic Amount: Small (1-33%) Necrotic Quality: Adherent Slough Treatment Notes Wound #1 (Sacrum) Wound Laterality: Midline Cleanser Peri-Wound Care Topical Primary Dressing Hydrofera Blue Classic Foam Rope Dressing, 9x6  (mm/in) Discharge Instruction: rope needs to be cut in half to fit wound [name], [name] ([id number]) Secondary Dressing Silvercel 4 1/4x 4 1/4 (in/in) Discharge Instruction: Apply Silvercel 4 1/4x 4 1/4 (in/in) as instructed cut a piece and fold in half and place in crease Secured With Compression Wrap Compression Stockings Add-Ons Electronic Signature(s) Signed: 12/04/2021 4:20:39 PM By: Levora Dredge Entered By: Levora Dredge on 12/04/2021 11:34:47 [name], [name] ([id number]) -------------------------------------------------------------------------------- Vitals Details Patient Name: [name] Date of Service: 12/04/2021 11:15 AM Medical Record Number: [id number] Patient Account Number: [id number] Date of Birth/Sex: 03/07/1986 (36 y.o. M) Treating RN: Levora Dredge Primary Care Ivette Castronova: Billey Gosling Other Clinician: Referring Jehad Bisono: Billey Gosling Treating Anyla Israelson/Extender: Skipper Cliche in Treatment: 2 Vital Signs Time Taken: 11:24 Temperature (F): 97.5 Height (in): 67 Pulse (bpm): 79 Weight (lbs): 255 Respiratory Rate (breaths/min): 18 Body Mass Index (BMI): 39.9 Blood Pressure (mmHg): 145/81 Reference Range: 80 - 120 mg / dl Electronic Signature(s) Signed: 12/04/2021 4:20:39 PM By: Levora Dredge Entered By: Levora Dredge on 12/04/2021 11:27:09

## 2021-12-04 NOTE — Progress Notes (Addendum)
[name], [name] ([id number]) Visit Report for 12/04/2021 Chief Complaint Document Details Patient Name: [name] Date of Service: 12/04/2021 11:15 AM Medical Record Number: [id number] Patient Account Number: [id number] Date of Birth/Sex: Dec 19, 1985 (36 y.o. M) Treating RN: [name] Primary Care Provider: Billey [name] Other Clinician: Referring Provider: Billey [name] Treating Provider/Extender: [name] in Treatment: 2 Information Obtained from: Patient Chief Complaint Disruption of pilonidal cyst removal Electronic Signature(s) Signed: 12/04/2021 11:34:50 AM By: Worthy Keeler PA-C Entered By: Worthy [name] on 12/04/2021 11:34:50 [name] ([id number]) -------------------------------------------------------------------------------- HPI Details Patient Name: [name] Date of Service: 12/04/2021 11:15 AM Medical Record Number: [id number] Patient Account Number: [id number] Date of Birth/Sex: [birth year] (36 y.o. M) Treating RN: [name] Primary Care Provider: Billey [name] Other Clinician: Referring Provider: Billey [name] Treating Provider/Extender: [name] in Treatment: 2 History of Present Illness HPI Description: 11/20/2021 upon evaluation patient presents for initial inspection here in our clinic concerning a history of having had a pilonidal cyst summer 2022. Following that time he was told that it could take up to 6 months for this to heal therefore he is waited until now which was the 45-month mark to come in to be seen here at the wound care [name]. Unfortunately I am just not certain that this has been healing as well from an internal perspective it is what should be and to be honest it still draining but again there is significant depth to the wound that really has not been packed appropriately up to this point. I do believe that we do need to address that today. The good news is he is otherwise fairly healthy there is no signs of  systemic infection and locally the infection seems to be pretty well controlled at this time. I do not see significant purulent drainage although I do see quite a bit of drainage in the internal aspect of this is very friable currently. 11/27/2021 upon evaluation today patient appears to be doing about the same in regard to his wound. Fortunately I do not see anything that seems to be worse or truly infected but unfortunately he still is very friable and having a lot of bleeding even with just light cleaning using a Q-tip and gauze. 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 He does have some what appears to be potentially swelling although it may just be scarring to the side on the right of the gluteal region just lateral to the sacrum. His wife who is here today states that she feels like that has been that way since the surgery but she cannot be 100% sure. Nonetheless in general I am concerned about the fact that this probably needs to be changed more frequently I think even daily and they are getting start doing this at home on their own when she is good to help him. I do believe we may want to see about an ultrasound however to see if there is any evidence of an ongoing pilonidal cyst here. 12/04/2021 upon evaluation today patient actually appears to be doing better in regard to the original wound. He has his ultrasound scheduled for the end of this week. He may have to reschedule that however as it appears that he is managing may not be in network for him which is unfortunate. Nonetheless I am encouraged by what I am seeing today going to the wound in general. He does have some excoriation of the  below I thought initially this may have been drainage but I think it might have actually been when he was shaving that he actually caused some irritation and breakdown of this region. Electronic Signature(s) Signed: 12/04/2021  5:11:24 PM By: Worthy Keeler PA-C Entered By: Worthy [name] on 12/04/2021 17:11:24 [name], [name] ([id number]) -------------------------------------------------------------------------------- Physical Exam Details Patient Name: [name] Date of Service: 12/04/2021 11:15 AM Medical Record Number: [id number] Patient Account Number: [id number] Date of Birth/Sex: [birth year] (36 y.o. M) Treating RN: [name] Primary Care Provider: Billey [name] Other Clinician: Referring Provider: Billey [name] Treating Provider/Extender: Jeri Cos Weeks in Treatment: 2 Constitutional Well-nourished and well-hydrated in no acute distress. Respiratory normal breathing without difficulty. Psychiatric this patient is able to make decisions and demonstrates good insight into disease process. Alert and Oriented x 3. pleasant and cooperative. Notes With where we stand today. Upon inspection patient's wound bed actually showed signs of good granulation epithelization at this point. I am actually pleased Overall I do not see any signs of active infection locally or systemically which is great news. Electronic Signature(s) Signed: 12/04/2021 5:11:52 PM By: Worthy Keeler PA-C Entered By: Worthy [name] on 12/04/2021 17:11:52 [name], [name] ([id number]) -------------------------------------------------------------------------------- Physician Orders Details Patient Name: [name] Date of Service: 12/04/2021 11:15 AM Medical Record Number: [id number] Patient Account Number: [id number] Date of Birth/Sex: 1986/02/10 (36 y.o. M) Treating RN: [name] Primary Care Provider: Billey [name] Other Clinician: Referring Provider: Billey [name] Treating Provider/Extender: [name] in Treatment: 2 Verbal / Phone Orders: No Diagnosis Coding ICD-10 Coding Code Description T81.31XA Disruption of external operation (surgical) wound, not elsewhere classified, initial encounter L98.492  Non-pressure chronic ulcer of skin of other sites with fat layer exposed L05.01 Pilonidal cyst with abscess Follow-up Appointments o Return Appointment in 1 week. o Nurse Visit as needed Bathing/ Shower/ Hygiene o May shower; gently cleanse wound with antibacterial soap, rinse and pat dry prior to dressing wounds - shower on days coming in to have dressing change o No tub bath. Anesthetic (Use 'Patient Medications' Section for Anesthetic Order Entry) o Lidocaine applied to wound bed Wound Treatment Wound #1 - Sacrum Wound Laterality: Midline Primary Dressing: Hydrofera Blue Classic Foam Rope Dressing, 9x6 (mm/in) 1 x Per Day/30 Days Discharge Instructions: rope needs to be cut in half to fit wound Secondary Dressing: Silvercel 4 1/4x 4 1/4 (in/in) (DME) (Generic) 1 x Per Day/30 Days Discharge Instructions: Apply Silvercel 4 1/4x 4 1/4 (in/in) as instructed cut a piece and fold in half and place in crease Electronic Signature(s) Signed: 12/04/2021 3:27:38 PM By: Worthy Keeler PA-C Signed: 12/04/2021 4:20:39 PM By: [name] Entered By: [name] on 12/04/2021 12:09:38 DAJOUN, SWANGER ([id number]) -------------------------------------------------------------------------------- Problem List Details Patient Name: [name] Date of Service: 12/04/2021 11:15 AM Medical Record Number: [id number] Patient Account Number: [id number] Date of Birth/Sex: 1985-12-09 (36 y.o. M) Treating RN: [name] Primary Care Provider: Billey [name] Other Clinician: Referring Provider: Billey [name] Treating Provider/Extender: [name] in Treatment: 2 Active Problems ICD-10 Encounter Code Description Active Date MDM Diagnosis T81.31XA Disruption of external operation (surgical) wound, not elsewhere 11/20/2021 No Yes classified, initial encounter L98.492 Non-pressure chronic ulcer of skin of other sites with fat layer exposed 11/20/2021 No Yes L05.01 Pilonidal cyst  with abscess 11/20/2021 No Yes Inactive Problems Resolved Problems Electronic Signature(s) Signed: 12/04/2021 11:34:34 AM By: Worthy Keeler PA-C Entered By: Worthy [name] on 12/04/2021 11:34:33 [place], [name] ([id number]) -------------------------------------------------------------------------------- Progress Note Details Patient Name: [name]  Date of Service: 12/04/2021 11:15 AM Medical Record Number: [id number] Patient Account Number: [id number] Date of Birth/Sex: 1986/06/09 (36 y.o. M) Treating RN: [name] Primary Care Provider: Billey [name] Other Clinician: Referring Provider: Billey [name] Treating Provider/Extender: [name] in Treatment: 2 Subjective Chief Complaint Information obtained from Patient Disruption of pilonidal cyst removal History of Present Illness (HPI) 11/20/2021 upon evaluation patient presents for initial inspection here in our clinic concerning a history of having had a pilonidal cyst summer 2022. Following that time he was told that it could take up to 6 months for this to heal therefore he is waited until now which was the 62-month mark to come in to be seen here at the wound care [name]. Unfortunately I am just not certain that this has been healing as well from an internal perspective it is what should be and to be honest it still draining but again there is significant depth to the wound that really has not been packed appropriately up to this point. I do believe that we do need to address that today. The good news is he is otherwise fairly healthy there is no signs of systemic infection and locally the infection seems to be pretty well controlled at this time. I do not see significant purulent drainage although I do see quite a bit of drainage in the internal aspect of this is very friable currently. 11/27/2021 upon evaluation today patient appears to be doing about the same in regard to his wound. Fortunately I do not see anything  that seems to be worse or truly infected but unfortunately he still is very friable and having a lot of bleeding even with just light cleaning using a Q-tip and gauze. 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 He does have some what appears to be potentially swelling although it may just be scarring to the side on the right of the gluteal region just lateral to the sacrum. His wife who is here today states that she feels like that has been that way since the surgery but she cannot be 100% sure. Nonetheless in general I am concerned about the fact that this probably needs to be changed more frequently I think even daily and they are getting start doing this at home on their own when she is good to help him. I do believe we may want to see about an ultrasound however to see if there is any evidence of an ongoing pilonidal cyst here. 12/04/2021 upon evaluation today patient actually appears to be doing better in regard to the original wound. He has his ultrasound scheduled for the end of this week. He may have to reschedule that however as it appears that he is managing may not be in network for him which is unfortunate. Nonetheless I am encouraged by what I am seeing today going to the wound in general. He does have some excoriation of the below I thought initially this may have been drainage but I think it might have actually been when he was shaving that he actually caused some irritation and breakdown of this region. Objective Constitutional Well-nourished and well-hydrated in no acute distress. Vitals Time Taken: 11:24 AM, Height: 67 in, Weight: 255 lbs, BMI: 39.9, Temperature: 97.5 F, Pulse: 79 bpm, Respiratory Rate: 18 breaths/min, Blood Pressure: 145/81 mmHg. Respiratory normal breathing without difficulty. Psychiatric this patient is able to make decisions and demonstrates good insight into disease process.  Alert  and Oriented x 3. pleasant and cooperative. General Notes: With where we stand today. Upon inspection patient's wound bed actually showed signs of good granulation epithelization at this point. I am actually pleased Overall I do not see any signs of active infection locally or systemically which is great news. Integumentary (Hair, Skin) Wound #1 status is Open. Original cause of wound was Surgical Injury. The date acquired was: 04/13/2021. The wound has been in treatment 2 weeks. The wound is located on the Midline Sacrum. The wound measures 2.7cm length x 0.5cm width x 3.5cm depth; 1.06cm^2 area and 3.711cm^3 volume. There is Fat Layer (Subcutaneous Tissue) exposed. There is no tunneling or undermining noted. There is a medium amount of serosanguineous drainage noted. There is large (67-100%) red granulation within the wound bed. There is a small (1-33%) amount of necrotic tissue within the wound bed including Adherent Slough. [name], [name] ([id number]) Assessment Active Problems ICD-10 Disruption of external operation (surgical) wound, not elsewhere classified, initial encounter Non-pressure chronic ulcer of skin of other sites with fat layer exposed Pilonidal cyst with abscess Plan Follow-up Appointments: Return Appointment in 1 week. Nurse Visit as needed Bathing/ Shower/ Hygiene: May shower; gently cleanse wound with antibacterial soap, rinse and pat dry prior to dressing wounds - shower on days coming in to have dressing change No tub bath. Anesthetic (Use 'Patient Medications' Section for Anesthetic Order Entry): Lidocaine applied to wound bed WOUND #1: - Sacrum Wound Laterality: Midline Primary Dressing: Hydrofera Blue Classic Foam Rope Dressing, 9x6 (mm/in) 1 x Per Day/30 Days Discharge Instructions: rope needs to be cut in half to fit wound Secondary Dressing: Silvercel 4 1/4x 4 1/4 (in/in) (DME) (Generic) 1 x Per Day/30 Days Discharge Instructions: Apply Silvercel 4 1/4x 4  1/4 (in/in) as instructed cut a piece and fold in half and place in crease 1. Recommend currently that we go ahead and continue with the [facility] dressing as far as the rope is concerned I think this is doing a good job. We will use a silver alginate dressing tucked between the gluteal fold region which I think can also be appropriately used at this point. He is can be changing the dressing daily to try to keep this area clean and dry. Were not can use any sticky dressings currently. 2. I am also going to recommend continued and appropriate cleansing of the wound area. He seems to be doing a great job his wife is helping him out. We will see patient back for reevaluation in 1 week here in the clinic. If anything worsens or changes patient will contact our office for additional recommendations. Electronic Signature(s) Signed: 12/04/2021 5:12:42 PM By: Worthy Keeler PA-C Entered By: Worthy [name] on 12/04/2021 17:12:42 [name], [name] ([id number]) -------------------------------------------------------------------------------- SuperBill Details Patient Name: [name] Date of Service: 12/04/2021 Medical Record Number: [id number] Patient Account Number: [id number] Date of Birth/Sex: [birth year] (36 y.o. M) Treating RN: [name] Primary Care Provider: Billey [name] Other Clinician: Referring Provider: Billey [name] Treating Provider/Extender: [name] in Treatment: 2 Diagnosis Coding ICD-10 Codes Code Description T81.31XA Disruption of external operation (surgical) wound, not elsewhere classified, initial encounter L98.492 Non-pressure chronic ulcer of skin of other sites with fat layer exposed L05.01 Pilonidal cyst with abscess Facility Procedures CPT4 Code: [id number] Description: [id number] - WOUND CARE VISIT-LEV 2 EST PT Modifier: Quantity: 1 Physician Procedures CPT4 Code: [id number] Description: 99214 - WC PHYS LEVEL 4 - EST PT Modifier: Quantity: 1 CPT4  Code: Description: ICD-10 Diagnosis Description  T81.31XA Disruption of external operation (surgical) wound, not elsewhere classifi L98.492 Non-pressure chronic ulcer of skin of other sites with fat layer exposed L05.01 Pilonidal cyst with abscess Modifier: ed, initial encounter Quantity: Electronic Signature(s) Signed: 12/04/2021 5:13:05 PM By: Worthy Keeler PA-C Previous Signature: 12/04/2021 3:27:38 PM Version By: Worthy Keeler PA-C Previous Signature: 12/04/2021 4:20:39 PM Version By: [name] Entered By: Worthy [name] on 12/04/2021 17:13:05

## 2021-12-05 DIAGNOSIS — T8131XA Disruption of external operation (surgical) wound, not elsewhere classified, initial encounter: Secondary | ICD-10-CM | POA: Diagnosis not present

## 2021-12-06 ENCOUNTER — Ambulatory Visit: Payer: BC Managed Care – PPO

## 2021-12-07 ENCOUNTER — Other Ambulatory Visit: Payer: BC Managed Care – PPO

## 2021-12-08 ENCOUNTER — Ambulatory Visit: Payer: BC Managed Care – PPO

## 2021-12-11 ENCOUNTER — Other Ambulatory Visit: Payer: Self-pay

## 2021-12-11 ENCOUNTER — Encounter: Payer: BC Managed Care – PPO | Admitting: Physician Assistant

## 2021-12-11 DIAGNOSIS — L98492 Non-pressure chronic ulcer of skin of other sites with fat layer exposed: Secondary | ICD-10-CM | POA: Diagnosis not present

## 2021-12-11 DIAGNOSIS — Y838 Other surgical procedures as the cause of abnormal reaction of the patient, or of later complication, without mention of misadventure at the time of the procedure: Secondary | ICD-10-CM | POA: Diagnosis not present

## 2021-12-11 DIAGNOSIS — L0501 Pilonidal cyst with abscess: Secondary | ICD-10-CM | POA: Diagnosis not present

## 2021-12-11 DIAGNOSIS — L98422 Non-pressure chronic ulcer of back with fat layer exposed: Secondary | ICD-10-CM | POA: Diagnosis not present

## 2021-12-11 DIAGNOSIS — T8131XA Disruption of external operation (surgical) wound, not elsewhere classified, initial encounter: Secondary | ICD-10-CM | POA: Diagnosis not present

## 2021-12-11 NOTE — Progress Notes (Addendum)
[name], [name] ([id number]) Visit Report for 12/11/2021 Chief Complaint Document Details Patient Name: [name], [name] Date of Service: 12/11/2021 9:00 AM Medical Record Number: [id number] Patient Account Number: [id number] Date of Birth/Sex: [birth year] (36 y.o. M) Treating RN: [name] PihGordon, [name] Primary Care Provider: Cheryll CockayneBurns, [name] Other Clinician: Referring Provider: Cheryll CockayneBurns, [name] Treating Provider/Extender: [name], [name] in Treatment: 3 Information Obtained from: Patient Chief Complaint Disruption of pilonidal cyst removal Electronic Signature(s) Signed: 12/11/2021 9:05:15 AM By: [name] KelpStone III, [name] Burtis PA-C Entered By: [name] KelpStone III, [name] on 12/11/2021 09:05:15 [place], [name] ([id number]) -------------------------------------------------------------------------------- HPI Details Patient Name: [name], [name] Date of Service: 12/11/2021 9:00 AM Medical Record Number: [id number] Patient Account Number: [id number] Date of Birth/Sex: [birth year] (36 y.o. M) Treating RN: [name] PihGordon, [name] Primary Care Provider: Cheryll CockayneBurns, [name] Other Clinician: Referring Provider: Cheryll CockayneBurns, [name] Treating Provider/Extender: [name], Keyen Marban [name] in Treatment: 3 History of Present Illness HPI Description: 11/20/2021 upon evaluation patient presents for initial inspection here in our clinic concerning a history of having had a pilonidal cyst summer 2022. Following that time he was told that it could take up to 6 months for this to heal therefore he is waited until now which was the 6090-month mark to come in to be seen here at the wound care center. Unfortunately I am just not certain that this has been healing as well from an internal perspective it is what should be and to be honest it still draining but again there is significant depth to the wound that really has not been packed appropriately up to this point. I do believe that we do need to address that today. The good news is he is otherwise fairly healthy there is no signs of  systemic infection and locally the infection seems to be pretty well controlled at this time. I do not see significant purulent drainage although I do see quite a bit of drainage in the internal aspect of this is very friable currently. 11/27/2021 upon evaluation today patient appears to be doing about the same in regard to his wound. Fortunately I do not see anything that seems to be worse or truly infected but unfortunately he still is very friable and having a lot of bleeding even with just light cleaning using a Q-tip and gauze. 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 He does have some what appears to be potentially swelling although it may just be scarring to the side on the right of the gluteal region just lateral to the sacrum. His wife who is here today states that she feels like that has been that way since the surgery but she cannot be 100% sure. Nonetheless in general I am concerned about the fact that this probably needs to be changed more frequently I think even daily and they are getting start doing this at home on their own when she is good to help him. I do believe we may want to see about an ultrasound however to see if there is any evidence of an ongoing pilonidal cyst here. 12/04/2021 upon evaluation today patient actually appears to be doing better in regard to the original wound. He has his ultrasound scheduled for the end of this week. He may have to reschedule that however as it appears that he is managing may not be in network for him which is unfortunate. Nonetheless I am encouraged by what I am seeing today going to the wound in general. He does have some excoriation of the  below I thought initially this may have been drainage but I think it might have actually been when he was shaving that he actually caused some irritation and breakdown of this region. 12/11/2021 upon evaluation today patient appears  to actually be doing much better in regard to his wound. The area of irritation down below was actually looking better as well although it still definitely there. I do believe that he may be better to use something like zinc in between this region and then just use a gauze packed in behind the packing for the [facility]. Overall I think that his wife and he are doing a great job with this wound in general. In fact it is doing so well I do not believe the ultrasound is in good to be necessary is good considering that he cannot even get any solid answers from his insurance company about whether they will cover it or even if the location where this is scheduled is even in network. Electronic Signature(s) Signed: 12/11/2021 9:29:21 AM By: [name] Kelp PA-C Entered By: [name] on 12/11/2021 09:29:21 [name] ([id number]) -------------------------------------------------------------------------------- Physical Exam Details Patient Name: [name] Date of Service: 12/11/2021 9:00 AM Medical Record Number: [id number] Patient Account Number: [id number] Date of Birth/Sex: [birth year] (36 y.o. M) Treating RN: [name] Primary Care Provider: Cheryll [name] Other Clinician: Referring Provider: Cheryll [name] Treating Provider/Extender: [name] [name] in Treatment: 3 Constitutional Well-nourished and well-hydrated in no acute distress. Respiratory normal breathing without difficulty. Psychiatric this patient is able to make decisions and demonstrates good insight into disease process. Alert and Oriented x 3. pleasant and cooperative. Notes Patient's wound bed actually showed signs of good granulation epithelization at this point. Fortunately I do not see any signs of active infection locally nor systemically at this time which is great news and overall very pleased with where we stand today. Electronic Signature(s) Signed: 12/11/2021 9:29:46 AM By: [name] Kelp  PA-C Entered By: [name] on 12/11/2021 09:29:45 RAAD, CLAYSON ([id number]) -------------------------------------------------------------------------------- Physician Orders Details Patient Name: [name] Date of Service: 12/11/2021 9:00 AM Medical Record Number: [id number] Patient Account Number: [id number] Date of Birth/Sex: Sep 06, 1986 (36 y.o. M) Treating RN: [name] Primary Care Provider: Cheryll [name] Other Clinician: Referring Provider: Cheryll [name] Treating Provider/Extender: [name] Blase in Treatment: 3 Verbal / Phone Orders: No Diagnosis Coding ICD-10 Coding Code Description T81.31XA Disruption of external operation (surgical) wound, not elsewhere classified, initial encounter L98.492 Non-pressure chronic ulcer of skin of other sites with fat layer exposed L05.01 Pilonidal cyst with abscess Follow-up Appointments o Return Appointment in 1 week. o Nurse Visit as needed Bathing/ Shower/ Hygiene o May shower; gently cleanse wound with antibacterial soap, rinse and pat dry prior to dressing wounds - shower on days coming in to have dressing change o No tub bath. Anesthetic (Use 'Patient Medications' Section for Anesthetic Order Entry) o Lidocaine applied to wound bed Wound Treatment Wound #1 - Sacrum Wound Laterality: Midline Topical: Desitin Maximum Strength Ointment, 1 (oz) tube 1 x Per Day/30 Days Discharge Instructions: applied below wound to protect skin from drainage Primary Dressing: Hydrofera Blue Classic Foam Rope Dressing, 9x6 (mm/in) 1 x Per Day/30 Days Discharge Instructions: rope cut in to quarters and 3 cm length Secondary Dressing: Gauze 1 x Per Day/30 Days Discharge Instructions: folded in half, placed over hydrofera blue to catch drainage Electronic Signature(s) Signed: 12/11/2021 9:42:08 AM By: [name] Signed: 12/11/2021 5:14:22 PM By: [name] Kelp PA-C Entered By: Roger Shelter,  [name] on 12/11/2021  09:42:07 [name], [name] ([id number]) -------------------------------------------------------------------------------- Problem List Details Patient Name: [name], [name] Date of Service: 12/11/2021 9:00 AM Medical Record Number: [id number] Patient Account Number: [id number] Date of Birth/Sex: [birth year] ([age] y.o. M) Treating RN: [name] PihGordon, [name] Primary Care Provider: Cheryll CockayneBurns, [name] Other Clinician: Referring Provider: Cheryll CockayneBurns, [name] Treating Provider/Extender: [name], Daylani Deblois [name] in Treatment: 3 Active Problems ICD-10 Encounter Code Description Active Date MDM Diagnosis T81.31XA Disruption of external operation (surgical) wound, not elsewhere 11/20/2021 No Yes classified, initial encounter L98.492 Non-pressure chronic ulcer of skin of other sites with fat layer exposed 11/20/2021 No Yes L05.01 Pilonidal cyst with abscess 11/20/2021 No Yes Inactive Problems Resolved Problems Electronic Signature(s) Signed: 12/11/2021 9:05:12 AM By: [name] KelpStone III, Anabelle Bungert PA-C Entered By: [name] KelpStone III, Lusia Greis on 12/11/2021 09:05:11 [place], [name] ([id number]) -------------------------------------------------------------------------------- Progress Note Details Patient Name: [name], [name] Date of Service: 12/11/2021 9:00 AM Medical Record Number: [id number] Patient Account Number: [id number] Date of Birth/Sex: [birth year] (36 y.o. M) Treating RN: [name] PihGordon, [name] Primary Care Provider: Cheryll CockayneBurns, [name] Other Clinician: Referring Provider: Cheryll CockayneBurns, [name] Treating Provider/Extender: [name], Keni Elison [name] in Treatment: 3 Subjective Chief Complaint Information obtained from Patient Disruption of pilonidal cyst removal History of Present Illness (HPI) 11/20/2021 upon evaluation patient presents for initial inspection here in our clinic concerning a history of having had a pilonidal cyst summer 2022. Following that time he was told that it could take up to 6 months for this to heal therefore he is waited until now which was the  2071-month mark to come in to be seen here at the wound care center. Unfortunately I am just not certain that this has been healing as well from an internal perspective it is what should be and to be honest it still draining but again there is significant depth to the wound that really has not been packed appropriately up to this point. I do believe that we do need to address that today. The good news is he is otherwise fairly healthy there is no signs of systemic infection and locally the infection seems to be pretty well controlled at this time. I do not see significant purulent drainage although I do see quite a bit of drainage in the internal aspect of this is very friable currently. 11/27/2021 upon evaluation today patient appears to be doing about the same in regard to his wound. Fortunately I do not see anything that seems to be worse or truly infected but unfortunately he still is very friable and having a lot of bleeding even with just light cleaning using a Q-tip and gauze. 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 He does have some what appears to be potentially swelling although it may just be scarring to the side on the right of the gluteal region just lateral to the sacrum. His wife who is here today states that she feels like that has been that way since the surgery but she cannot be 100% sure. Nonetheless in general I am concerned about the fact that this probably needs to be changed more frequently I think even daily and they are getting start doing this at home on their own when she is good to help him. I do believe we may want to see about an ultrasound however to see if there is any evidence of an ongoing pilonidal cyst here. 12/04/2021 upon evaluation today patient actually appears to be doing better in regard to the original wound. He has his  ultrasound scheduled for the end of this week. He may have to  reschedule that however as it appears that he is managing may not be in network for him which is unfortunate. Nonetheless I am encouraged by what I am seeing today going to the wound in general. He does have some excoriation of the below I thought initially this may have been drainage but I think it might have actually been when he was shaving that he actually caused some irritation and breakdown of this region. 12/11/2021 upon evaluation today patient appears to actually be doing much better in regard to his wound. The area of irritation down below was actually looking better as well although it still definitely there. I do believe that he may be better to use something like zinc in between this region and then just use a gauze packed in behind the packing for the [facility]. Overall I think that his wife and he are doing a great job with this wound in general. In fact it is doing so well I do not believe the ultrasound is in good to be necessary is good considering that he cannot even get any solid answers from his insurance company about whether they will cover it or even if the location where this is scheduled is even in network. Objective Constitutional Well-nourished and well-hydrated in no acute distress. Vitals Time Taken: 9:04 AM, Height: 67 in, Weight: 255 lbs, BMI: 39.9, Temperature: 97.5 F, Pulse: 85 bpm, Respiratory Rate: 18 breaths/min, Blood Pressure: 137/76 mmHg. Respiratory normal breathing without difficulty. Psychiatric this patient is able to make decisions and demonstrates good insight into disease process. Alert and Oriented x 3. pleasant and cooperative. General Notes: Patient's wound bed actually showed signs of good granulation epithelization at this point. Fortunately I do not see any signs of active infection locally nor systemically at this time which is great news and overall very pleased with where we stand today. Integumentary (Hair, Skin) Wound #1 status is  Open. Original cause of wound was Surgical Injury. The date acquired was: 04/13/2021. The wound has been in treatment 3 [name], [name] ([id number]) [name]. The wound is located on the Midline Sacrum. The wound measures 0.5cm length x 0.3cm width x 1.9cm depth; 0.118cm^2 area and 0.224cm^3 volume. There is Fat Layer (Subcutaneous Tissue) exposed. There is no tunneling or undermining noted. There is a medium amount of serosanguineous drainage noted. There is large (67-100%) red granulation within the wound bed. There is a small (1-33%) amount of necrotic tissue within the wound bed. Assessment Active Problems ICD-10 Disruption of external operation (surgical) wound, not elsewhere classified, initial encounter Non-pressure chronic ulcer of skin of other sites with fat layer exposed Pilonidal cyst with abscess Plan Follow-up Appointments: Return Appointment in 1 week. Nurse Visit as needed Bathing/ Shower/ Hygiene: May shower; gently cleanse wound with antibacterial soap, rinse and pat dry prior to dressing wounds - shower on days coming in to have dressing change No tub bath. Anesthetic (Use 'Patient Medications' Section for Anesthetic Order Entry): Lidocaine applied to wound bed WOUND #1: - Sacrum Wound Laterality: Midline Primary Dressing: Hydrofera Blue Classic Foam Rope Dressing, 9x6 (mm/in) 1 x Per Day/30 Days Discharge Instructions: rope needs to be cut in half to fit wound Secondary Dressing: Silvercel 4 1/4x 4 1/4 (in/in) (Generic) 1 x Per Day/30 Days Discharge Instructions: Apply Silvercel 4 1/4x 4 1/4 (in/in) as instructed cut a piece and fold in half and place in crease 1. Would recommend currently that based  on the fact that this is actually showing signs of excellent improvement and is significantly better even than last week that we go ahead and discontinue the recommendation for the ultrasound. He is in agreement with that plan. 2. Also can recommend at this time that we have  the patient continue with the Hydrofera Blue packed into the tunnel area I think this is doing an awesome job. 3. I would also suggest that we continue to protect the area between the anal region and the wound. I Ernie Hew suggest that we actually switch over to using Desitin over this area which should be a good skin protectant. I am also can recommend that the patient use a dry gauze tucked in between the gluteal fold region where the Desitin is up to where the wound is and I think this is good to be the best way to secure something in place at this point. He will be changing this daily as before. We will see patient back for reevaluation in 1 week here in the clinic. If anything worsens or changes patient will contact our office for additional recommendations. Electronic Signature(s) Signed: 12/11/2021 9:30:49 AM By: [name] Kelp PA-C Entered By: [name] on 12/11/2021 09:30:48 Banning, Asa ([id number]) -------------------------------------------------------------------------------- SuperBill Details Patient Name: [name] Date of Service: 12/11/2021 Medical Record Number: [id number] Patient Account Number: [id number] Date of Birth/Sex: 1986/08/23 (36 y.o. M) Treating RN: [name] Primary Care Provider: Cheryll [name] Other Clinician: Referring Provider: Cheryll [name] Treating Provider/Extender: [name] Blase in Treatment: 3 Diagnosis Coding ICD-10 Codes Code Description T81.31XA Disruption of external operation (surgical) wound, not elsewhere classified, initial encounter L98.492 Non-pressure chronic ulcer of skin of other sites with fat layer exposed L05.01 Pilonidal cyst with abscess Facility Procedures CPT4 Code: [id number] Description: [phone] - WOUND CARE VISIT-LEV 2 EST PT Modifier: Quantity: 1 Physician Procedures CPT4 Code: [id number] Description: 99214 - WC PHYS LEVEL 4 - EST PT Modifier: Quantity: 1 CPT4 Code: Description: ICD-10 Diagnosis Description  T81.31XA Disruption of external operation (surgical) wound, not elsewhere classifi L98.492 Non-pressure chronic ulcer of skin of other sites with fat layer exposed L05.01 Pilonidal cyst with abscess Modifier: ed, initial encounter Quantity: Electronic Signature(s) Signed: 12/11/2021 9:39:05 AM By: [name] Signed: 12/11/2021 5:14:22 PM By: [name] Kelp PA-C Previous Signature: 12/11/2021 9:31:07 AM Version By: [name] Kelp PA-C Entered By: [name] on 12/11/2021 09:39:04

## 2021-12-11 NOTE — Progress Notes (Signed)
[name], [name] ([id number]) Visit Report for 12/11/2021 Arrival Information Details Patient Name: [name], [name] Date of Service: 12/11/2021 9:00 AM Medical Record Number: [id number] Patient Account Number: [id number] Date of Birth/Sex: 1986/06/05 (36 y.o. M) Treating RN: [name] Primary Care [name]: [name] Other Clinician: Referring [name]: [name] Treating [name]/Extender: [name] in Treatment: 3 Visit Information History Since Last Visit Added or deleted any medications: No Patient Arrived: Ambulatory Any new allergies or adverse reactions: No Arrival Time: 09:01 Had a fall or experienced change in No Accompanied By: self activities of daily living that may affect Transfer Assistance: None risk of falls: Patient Identification Verified: Yes Hospitalized since last visit: No Secondary Verification Process Completed: Yes Has Dressing in Place as Prescribed: Yes Patient Requires Transmission-Based Precautions: No Pain Present Now: No Electronic Signature(s) Signed: 12/11/2021 4:31:00 PM By: [name] Entered By: [name] on 12/11/2021 09:04:33 [name] ([id number]) -------------------------------------------------------------------------------- Clinic Level of Care Assessment Details Patient Name: [name] Date of Service: 12/11/2021 9:00 AM Medical Record Number: [id number] Patient Account Number: [id number] Date of Birth/Sex: 06/27/1986 (36 y.o. M) Treating RN: [name] Primary Care [name]: [name] Other Clinician: Referring [name]: [name] Treating Acelin Ferdig/Extender: [name] in Treatment: 3 Clinic Level of Care Assessment Items TOOL 4 Quantity Score []  - Use when only an EandM is performed on FOLLOW-UP visit 0 ASSESSMENTS - Nursing Assessment / Reassessment []  - Reassessment of Co-morbidities (includes updates in patient status) 0 []  - 0 Reassessment of Adherence to Treatment  Plan ASSESSMENTS - Wound and Skin Assessment / Reassessment X - Simple Wound Assessment / Reassessment - one wound 1 5 []  - 0 Complex Wound Assessment / Reassessment - multiple wounds []  - 0 Dermatologic / Skin Assessment (not related to wound area) ASSESSMENTS - Focused Assessment []  - Circumferential Edema Measurements - multi extremities 0 []  - 0 Nutritional Assessment / Counseling / Intervention []  - 0 Lower Extremity Assessment (monofilament, tuning fork, pulses) []  - 0 Peripheral Arterial Disease Assessment (using hand held doppler) ASSESSMENTS - Ostomy and/or Continence Assessment and Care []  - Incontinence Assessment and Management 0 []  - 0 Ostomy Care Assessment and Management (repouching, etc.) PROCESS - Coordination of Care X - Simple Patient / Family Education for ongoing care 1 15 []  - 0 Complex (extensive) Patient / Family Education for ongoing care []  - 0 Staff obtains , Records, Test Results / Process Orders []  - 0 Staff telephones HHA, Nursing Homes / Clarify orders / etc []  - 0 Routine Transfer to another Facility (non-emergent condition) []  - 0 Routine Hospital Admission (non-emergent condition) []  - 0 New Admissions / / Ordering NPWT, Apligraf, etc. []  - 0 Emergency Hospital Admission (emergent condition) X- 1 10 Simple Discharge Coordination []  - 0 Complex (extensive) Discharge Coordination PROCESS - Special Needs []  - Pediatric / Minor Patient Management 0 []  - 0 Isolation Patient Management []  - 0 Hearing / Language / Visual special needs []  - 0 Assessment of Community assistance (transportation, D/C planning, etc.) []  - 0 Additional assistance / Altered mentation []  - 0 Support Surface(s) Assessment (bed, cushion, seat, etc.) INTERVENTIONS - Wound Cleansing / Measurement [name], [name] ( ) X- 1 5 Simple Wound Cleansing - one wound []  - 0 Complex Wound Cleansing - multiple wounds X- 1 5 Wound  Imaging (photographs - any number of wounds) []  - 0 Wound Tracing (instead of photographs) X- 1 5 Simple Wound Measurement - one wound []  - 0 Complex Wound Measurement - multiple wounds  INTERVENTIONS - Wound Dressings X - Small Wound Dressing one or multiple wounds 1 10 []  - 0 Medium Wound Dressing one or multiple wounds []  - 0 Large Wound Dressing one or multiple wounds []  - 0 Application of Medications - topical []  - 0 Application of Medications - injection INTERVENTIONS - Miscellaneous []  - External ear exam 0 []  - 0 Specimen Collection (cultures, biopsies, blood, body fluids, etc.) []  - 0 Specimen(s) / Culture(s) sent or taken to Lab for analysis []  - 0 Patient Transfer (multiple staff / / Similar devices) []  - 0 Simple Staple / Suture removal (25 or less) []  - 0 Complex Staple / Suture removal (26 or more) []  - 0 Hypo / Hyperglycemic Management (close monitor of Blood Glucose) []  - 0 Ankle / Brachial Index (ABI) - do not check if billed separately X- 1 5 Vital Signs Has the patient been seen at the hospital within the last three years: Yes Total Score: 60 Level Of Care: New/Established - Level 2 Electronic Signature(s) Signed: 12/11/2021 4:31:00 PM By: Entered By: on 12/11/2021 09:38:12 ( ) -------------------------------------------------------------------------------- Encounter Discharge Information Details Patient Name: Date of Service: 12/11/2021 9:00 AM Medical Record Number: Patient Account Number: Date of Birth/Sex: [birth year] (36 y.o. M) Treating RN: 12/13/2021 Primary Care [name]: [name] Other Clinician: Referring [name]: [name] Treating [name]/Extender: 12/13/2021 in Treatment: 3 Encounter Discharge Information Items Discharge Condition: Stable Ambulatory Status: Ambulatory Discharge Destination:  Home Transportation: Private Auto Accompanied By: self Schedule Follow-up Appointment: Yes Clinical Summary of Care: Electronic Signature(s) Signed: 12/11/2021 9:43:15 AM By: [name] Entered By: [name] on 12/11/2021 09:43:14 [place], [place] ([id number]) -------------------------------------------------------------------------------- Lower Extremity Assessment Details Patient Name: 11/19/1985 Date of Service: 12/11/2021 9:00 AM Medical Record Number: [name] Patient Account Number: [name] Date of Birth/Sex: Oct 01, 1986 (36 y.o. M) Treating RN: 12/13/2021 Primary Care Marialuisa Basara: [name] Other Clinician: Referring Floreen Teegarden: [name] Treating Trevonn Hallum/Extender: 12/13/2021 Weeks in Treatment: 3 Electronic Signature(s) Signed: 12/11/2021 4:31:00 PM By: [id number] Entered By: [name] on 12/11/2021 09:18:09 [name], [name] ([id number]) -------------------------------------------------------------------------------- Multi Wound Chart Details Patient Name: 11/19/1985 Date of Service: 12/11/2021 9:00 AM Medical Record Number: [name] Patient Account Number: [name] Date of Birth/Sex: [birth year] (36 y.o. M) Treating RN: 12/13/2021 Primary Care Rene Sizelove: [name] Other Clinician: Referring Brityn Mastrogiovanni: [name] Treating Dreyson Mishkin/Extender: 12/13/2021 in Treatment: 3 Vital Signs Height(in): 67 Pulse(bpm): 85 Weight(lbs): 255 Blood Pressure(mmHg): 137/76 Body Mass Index(BMI): 39.9 Temperature(F): 97.5 Respiratory Rate(breaths/min): 18 Photos: [N/A:N/A] Wound Location: Midline Sacrum N/A N/A Wounding Event: Surgical Injury N/A N/A Primary Etiology: Pilonidal Cyst N/A N/A Date Acquired: 04/13/2021 N/A N/A Weeks of Treatment: 3 N/A N/A Wound Status: Open N/A N/A Wound Recurrence: No N/A N/A Measurements L x W x D (cm) 0.5x0.3x1.9 N/A N/A Area (cm) : 0.118 N/A N/A Volume (cm) : 0.224 N/A N/A % Reduction in  Area: 70.00% N/A N/A % Reduction in Volume: 83.70% N/A N/A Classification: Full Thickness Without Exposed N/A N/A Support Structures Exudate Amount: Medium N/A N/A Exudate Type: Serosanguineous N/A N/A Exudate Color: red, brown N/A N/A Granulation Amount: Large (67-100%) N/A N/A Granulation Quality: Red N/A N/A Necrotic Amount: Small (1-33%) N/A N/A Exposed Structures: Fat Layer (Subcutaneous Tissue): N/A N/A Yes Epithelialization: None N/A N/A Treatment Notes Electronic Signature(s) Signed: 12/11/2021 4:31:00 PM By: [name] Entered By: 12/13/2021 on 12/11/2021 09:20:42 [id number] (11/19/1985) -------------------------------------------------------------------------------- Multi-Disciplinary Care Plan Details Patient Name:  [name] ShearerOLLINS, [name] Date of Service: 12/11/2021 9:00 AM Medical Record Number: [id number] Patient Account Number: [id number] Date of Birth/Sex: January 20, 1986 (36 y.o. M) Treating RN: [name] PihGordon, Caitlin Primary Care Kimari Lienhard: [name] CockayneBurns, Stacy Other Clinician: Referring Josee Speece: [name] CockayneBurns, Stacy Treating Selma Mink/Extender: [name] BlaseStone, Hoyt Weeks in Treatment: 3 Active Inactive Orientation to the Wound Care Program Nursing Diagnoses: Knowledge deficit related to the wound healing center program Goals: Patient/caregiver will verbalize understanding of the Wound Healing Center Program Date Initiated: 11/20/2021 Target Resolution Date: 11/20/2021 Goal Status: Active Interventions: Provide education on orientation to the wound center Notes: Wound/Skin Impairment Nursing Diagnoses: Impaired tissue integrity Knowledge deficit related to ulceration/compromised skin integrity Goals: Ulcer/skin breakdown will have a volume reduction of 30% by week 4 Date Initiated: 11/20/2021 Target Resolution Date: 12/18/2021 Goal Status: Active Ulcer/skin breakdown will have a volume reduction of 50% by week 8 Date Initiated: 11/20/2021 Target Resolution Date: 01/15/2022 Goal Status:  Active Ulcer/skin breakdown will have a volume reduction of 80% by week 12 Date Initiated: 11/20/2021 Target Resolution Date: 02/12/2022 Goal Status: Active Ulcer/skin breakdown will heal within 14 weeks Date Initiated: 11/20/2021 Target Resolution Date: 02/26/2022 Goal Status: Active Interventions: Assess patient/caregiver ability to obtain necessary supplies Assess patient/caregiver ability to perform ulcer/skin care regimen upon admission and as needed Assess ulceration(s) every visit Provide education on ulcer and skin care Notes: Electronic Signature(s) Signed: 12/11/2021 4:31:00 PM By: [name] PihGordon, Caitlin Entered By: [name] PihGordon, Caitlin on 12/11/2021 09:20:30 [name] ShearerOLLINS, [name] ([id number]) -------------------------------------------------------------------------------- Pain Assessment Details Patient Name: [name] ShearerOLLINS, [name] Date of Service: 12/11/2021 9:00 AM Medical Record Number: [id number] Patient Account Number: [id number] Date of Birth/Sex: January 20, 1986 (36 y.o. M) Treating RN: [name] PihGordon, Caitlin Primary Care Kayvon Mo: [name] CockayneBurns, Stacy Other Clinician: Referring Amauria Younts: [name] CockayneBurns, Stacy Treating Sherlie Boyum/Extender: [name] BlaseStone, Hoyt Weeks in Treatment: 3 Active Problems Location of Pain Severity and Description of Pain Patient Has Paino No Site Locations Rate the pain. Current Pain Level: 0 Pain Management and Medication Current Pain Management: Electronic Signature(s) Signed: 12/11/2021 4:31:00 PM By: [name] PihGordon, Caitlin Entered By: [name] PihGordon, Caitlin on 12/11/2021 09:13:10 [name] ShearerOLLINS, [name] ([id number]) -------------------------------------------------------------------------------- Patient/Caregiver Education Details Patient Name: [name] ShearerOLLINS, [name] Date of Service: 12/11/2021 9:00 AM Medical Record Number: [id number] Patient Account Number: [id number] Date of Birth/Gender: January 20, 1986 (36 y.o. M) Treating RN: [name] PihGordon, Caitlin Primary Care Physician: [name] CockayneBurns, Stacy Other Clinician: Referring Physician: Cheryll CockayneBurns,  Stacy Treating Physician/Extender: [name] BlaseStone, Hoyt Weeks in Treatment: 3 Education Assessment Education Provided To: Patient Education Topics Provided Wound/Skin Impairment: Handouts: Caring for Your Ulcer Methods: Explain/Verbal Responses: State content correctly Electronic Signature(s) Signed: 12/11/2021 4:31:00 PM By: [name] PihGordon, Caitlin Entered By: [name] PihGordon, Caitlin on 12/11/2021 09:39:35 [place]LL[place], [name] ([id number]) -------------------------------------------------------------------------------- Wound Assessment Details Patient Name: [name] ShearerOLLINS, [name] Date of Service: 12/11/2021 9:00 AM Medical Record Number: [id number] Patient Account Number: [id number] Date of Birth/Sex: January 20, 1986 (36 y.o. M) Treating RN: [name] PihGordon, Caitlin Primary Care Marx Doig: [name] CockayneBurns, Stacy Other Clinician: Referring Elianna Windom: [name] CockayneBurns, Stacy Treating Breunna Nordmann/Extender: Allen DerryStone, Hoyt Weeks in Treatment: 3 Wound Status Wound Number: 1 Primary Etiology: Pilonidal Cyst Wound Location: Midline Sacrum Wound Status: Open Wounding Event: Surgical Injury Date Acquired: 04/13/2021 Weeks Of Treatment: 3 Clustered Wound: No Photos Wound Measurements Length: (cm) 0.5 Width: (cm) 0.3 Depth: (cm) 2.4 Area: (cm) 0.118 Volume: (cm) 0.283 % Reduction in Area: 70% % Reduction in Volume: 79.4% Epithelialization: None Tunneling: No Undermining: No Wound Description Classification: Full Thickness Without Exposed Support Structu Exudate Amount: Medium Exudate Type: Serosanguineous Exudate Color: red, brown res Foul Odor After Cleansing: No Slough/Fibrino Yes Wound Bed Granulation Amount: Large (67-100%) Exposed Structure Granulation Quality: Red Fat Layer (Subcutaneous Tissue)  Exposed: Yes Necrotic Amount: Small (1-33%) Treatment Notes Wound #1 (Sacrum) Wound Laterality: Midline Cleanser Peri-Wound Care Topical Desitin Maximum Strength Ointment, 1 (oz) tube Discharge Instruction: applied below wound to protect skin from  drainage Primary Dressing Hydrofera Blue Classic Foam Rope Dressing, 9x6 (mm/in) [name], [name] ([id number]) Discharge Instruction: rope cut in to quarters and 3 cm length Secondary Dressing Gauze Discharge Instruction: folded in half, placed over hydrofera blue to catch drainage Secured With Compression Wrap Compression Stockings Add-Ons Electronic Signature(s) Signed: 12/11/2021 4:31:00 PM By: [name] Entered By: [name] on 12/11/2021 09:37:15 [name], [name] ([id number]) -------------------------------------------------------------------------------- Vitals Details Patient Name: [name] Date of Service: 12/11/2021 9:00 AM Medical Record Number: [id number] Patient Account Number: [id number] Date of Birth/Sex: [birth year] (36 y.o. M) Treating RN: [name] Primary Care Brenden Rudman: [name] Other Clinician: Referring Lashay Osborne: [name] Treating Panagiota Perfetti/Extender: [name] in Treatment: 3 Vital Signs Time Taken: 09:04 Temperature (F): 97.5 Height (in): 67 Pulse (bpm): 85 Weight (lbs): 255 Respiratory Rate (breaths/min): 18 Body Mass Index (BMI): 39.9 Blood Pressure (mmHg): 137/76 Reference Range: 80 - 120 mg / dl Electronic Signature(s) Signed: 12/11/2021 4:31:00 PM By: [name] Entered By: [name] on 12/11/2021 09:06:03

## 2021-12-13 ENCOUNTER — Ambulatory Visit: Payer: BC Managed Care – PPO

## 2021-12-14 ENCOUNTER — Other Ambulatory Visit: Payer: BC Managed Care – PPO

## 2021-12-15 ENCOUNTER — Ambulatory Visit: Payer: BC Managed Care – PPO

## 2021-12-18 ENCOUNTER — Encounter: Payer: BC Managed Care – PPO | Admitting: Physician Assistant

## 2021-12-25 ENCOUNTER — Other Ambulatory Visit: Payer: Self-pay

## 2021-12-25 ENCOUNTER — Encounter: Payer: BC Managed Care – PPO | Attending: Physician Assistant | Admitting: Physician Assistant

## 2021-12-25 DIAGNOSIS — L0501 Pilonidal cyst with abscess: Secondary | ICD-10-CM | POA: Insufficient documentation

## 2021-12-25 DIAGNOSIS — T8131XA Disruption of external operation (surgical) wound, not elsewhere classified, initial encounter: Secondary | ICD-10-CM | POA: Insufficient documentation

## 2021-12-25 DIAGNOSIS — L98492 Non-pressure chronic ulcer of skin of other sites with fat layer exposed: Secondary | ICD-10-CM | POA: Diagnosis not present

## 2021-12-25 DIAGNOSIS — L98422 Non-pressure chronic ulcer of back with fat layer exposed: Secondary | ICD-10-CM | POA: Diagnosis not present

## 2021-12-25 NOTE — Progress Notes (Signed)
CORBYN, WILDEY ([id number]) Visit Report for 12/25/2021 Arrival Information Details Patient Name: [name], [name] Date of Service: 12/25/2021 9:00 AM Medical Record Number: [id number] Patient Account Number: [id number] Date of Birth/Sex: [birth year] (36 y.o. M) Treating RN: Angelina Pih Primary Care Madisun Hargrove: Cheryll Cockayne Other Clinician: Referring Rivaldo Hineman: Cheryll Cockayne Treating Jasman Murri/Extender: Rowan Blase in Treatment: 5 Visit Information History Since Last Visit Added or deleted any medications: No Patient Arrived: Ambulatory Any new allergies or adverse reactions: No Arrival Time: 09:02 Had a fall or experienced change in No Accompanied By: self activities of daily living that may affect Transfer Assistance: None risk of falls: Patient Identification Verified: Yes Hospitalized since last visit: No Secondary Verification Process Completed: Yes Has Dressing in Place as Prescribed: Yes Patient Requires Transmission-Based Precautions: No Pain Present Now: No Electronic Signature(s) Signed: 12/25/2021 3:20:36 PM By: Angelina Pih Entered By: Angelina Pih on 12/25/2021 09:04:38 [name] ([id number]) -------------------------------------------------------------------------------- Clinic Level of Care Assessment Details Patient Name: [name] Date of Service: 12/25/2021 9:00 AM Medical Record Number: [id number] Patient Account Number: [id number] Date of Birth/Sex: 1986-01-25 (36 y.o. M) Treating RN: Angelina Pih Primary Care Starsha Morning: Cheryll Cockayne Other Clinician: Referring Diamante Truszkowski: Cheryll Cockayne Treating Boniface Goffe/Extender: Rowan Blase in Treatment: 5 Clinic Level of Care Assessment Items TOOL 4 Quantity Score []  - Use when only an EandM is performed on FOLLOW-UP visit 0 ASSESSMENTS - Nursing Assessment / Reassessment []  - Reassessment of Co-morbidities (includes updates in patient status) 0 X- 1 5 Reassessment of Adherence to Treatment  Plan ASSESSMENTS - Wound and Skin Assessment / Reassessment X - Simple Wound Assessment / Reassessment - one wound 1 5 []  - 0 Complex Wound Assessment / Reassessment - multiple wounds []  - 0 Dermatologic / Skin Assessment (not related to wound area) ASSESSMENTS - Focused Assessment []  - Circumferential Edema Measurements - multi extremities 0 []  - 0 Nutritional Assessment / Counseling / Intervention []  - 0 Lower Extremity Assessment (monofilament, tuning fork, pulses) []  - 0 Peripheral Arterial Disease Assessment (using hand held doppler) ASSESSMENTS - Ostomy and/or Continence Assessment and Care []  - Incontinence Assessment and Management 0 []  - 0 Ostomy Care Assessment and Management (repouching, etc.) PROCESS - Coordination of Care X - Simple Patient / Family Education for ongoing care 1 15 []  - 0 Complex (extensive) Patient / Family Education for ongoing care []  - 0 Staff obtains , Records, Test Results / Process Orders []  - 0 Staff telephones HHA, Nursing Homes / Clarify orders / etc []  - 0 Routine Transfer to another Facility (non-emergent condition) []  - 0 Routine Hospital Admission (non-emergent condition) []  - 0 New Admissions / / Ordering NPWT, Apligraf, etc. []  - 0 Emergency Hospital Admission (emergent condition) X- 1 10 Simple Discharge Coordination []  - 0 Complex (extensive) Discharge Coordination PROCESS - Special Needs []  - Pediatric / Minor Patient Management 0 []  - 0 Isolation Patient Management []  - 0 Hearing / Language / Visual special needs []  - 0 Assessment of Community assistance (transportation, D/C planning, etc.) []  - 0 Additional assistance / Altered mentation []  - 0 Support Surface(s) Assessment (bed, cushion, seat, etc.) INTERVENTIONS - Wound Cleansing / Measurement Lofton, Kunio ( ) X- 1 5 Simple Wound Cleansing - one wound []  - 0 Complex Wound Cleansing - multiple wounds X- 1 5 Wound  Imaging (photographs - any number of wounds) []  - 0 Wound Tracing (instead of photographs) X- 1 5 Simple Wound Measurement - one wound []  - 0 Complex Wound Measurement - multiple wounds  INTERVENTIONS - Wound Dressings X - Small Wound Dressing one or multiple wounds 1 10 []  - 0 Medium Wound Dressing one or multiple wounds []  - 0 Large Wound Dressing one or multiple wounds []  - 0 Application of Medications - topical []  - 0 Application of Medications - injection INTERVENTIONS - Miscellaneous []  - External ear exam 0 []  - 0 Specimen Collection (cultures, biopsies, blood, body fluids, etc.) []  - 0 Specimen(s) / Culture(s) sent or taken to Lab for analysis []  - 0 Patient Transfer (multiple staff / / Similar devices) []  - 0 Simple Staple / Suture removal (25 or less) []  - 0 Complex Staple / Suture removal (26 or more) []  - 0 Hypo / Hyperglycemic Management (close monitor of Blood Glucose) []  - 0 Ankle / Brachial Index (ABI) - do not check if billed separately X- 1 5 Vital Signs Has the patient been seen at the hospital within the last three years: Yes Total Score: 65 Level Of Care: New/Established - Level 2 Electronic Signature(s) Signed: 12/25/2021 3:20:36 PM By: Entered By: on 12/25/2021 09:29:09 ( ) -------------------------------------------------------------------------------- Encounter Discharge Information Details Patient Name: Date of Service: 12/25/2021 9:00 AM Medical Record Number: Patient Account Number: Date of Birth/Sex: [birth year] (36 y.o. M) Treating RN: 02/24/2022 Primary Care Brenlee Koskela: Angelina Pih Other Clinician: Referring Keelia Graybill: Angelina Pih Treating Exie Chrismer/Extender: 02/24/2022 in Treatment: 5 Encounter Discharge Information Items Discharge Condition: Stable Ambulatory Status: Ambulatory Discharge Destination: Home Transportation:  Private Auto Accompanied By: self Schedule Follow-up Appointment: Yes Clinical Summary of Care: Electronic Signature(s) Signed: 12/25/2021 9:31:59 AM By: [id number] Entered By: [name] on 12/25/2021 09:31:59 [id number] ([id number]) -------------------------------------------------------------------------------- Lower Extremity Assessment Details Patient Name: 11/19/1985 Date of Service: 12/25/2021 9:00 AM Medical Record Number: Angelina Pih Patient Account Number: Cheryll Cockayne Date of Birth/Sex: 1986/03/11 (36 y.o. M) Treating RN: 02/24/2022 Primary Care Bernhard Koskinen: Angelina Pih Other Clinician: Referring Allena Pietila: Angelina Pih Treating Ming Mcmannis/Extender: 02/24/2022 Weeks in Treatment: 5 Electronic Signature(s) Signed: 12/25/2021 3:20:36 PM By: [id number] Entered By: [name] on 12/25/2021 09:15:37 MIRZA, FESSEL ([id number]) -------------------------------------------------------------------------------- Multi Wound Chart Details Patient Name: 11/19/1985 Date of Service: 12/25/2021 9:00 AM Medical Record Number: Angelina Pih Patient Account Number: Cheryll Cockayne Date of Birth/Sex: 1985-12-31 (36 y.o. M) Treating RN: 02/24/2022 Primary Care Freeman Borba: Angelina Pih Other Clinician: Referring Jamarco Zaldivar: Angelina Pih Treating Digna Countess/Extender: 02/24/2022 in Treatment: 5 Vital Signs Height(in): 67 Pulse(bpm): 47 Weight(lbs): 255 Blood Pressure(mmHg): 129/81 Body Mass Index(BMI): 39.9 Temperature(F): 98.1 Respiratory Rate(breaths/min): 18 Photos: [N/A:N/A] Wound Location: Midline Sacrum N/A N/A Wounding Event: Surgical Injury N/A N/A Primary Etiology: Pilonidal Cyst N/A N/A Date Acquired: 04/13/2021 N/A N/A Weeks of Treatment: 5 N/A N/A Wound Status: Open N/A N/A Wound Recurrence: No N/A N/A Measurements L x W x D (cm) 0.2x0.2x0.1 N/A N/A Area (cm) : 0.031 N/A N/A Volume (cm) : 0.003 N/A N/A % Reduction in Area: 92.10% N/A N/A %  Reduction in Volume: 99.80% N/A N/A Classification: Full Thickness Without Exposed N/A N/A Support Structures Exudate Amount: Medium N/A N/A Exudate Type: Serosanguineous N/A N/A Exudate Color: red, brown N/A N/A Granulation Amount: Large (67-100%) N/A N/A Granulation Quality: Red N/A N/A Necrotic Amount: Small (1-33%) N/A N/A Exposed Structures: Fat Layer (Subcutaneous Tissue): N/A N/A Yes Epithelialization: None N/A N/A Treatment Notes Electronic Signature(s) Signed: 12/25/2021 3:20:36 PM By: [name] Entered By: 02/24/2022 on 12/25/2021 09:16:02 [id number] (11/19/1985) -------------------------------------------------------------------------------- Multi-Disciplinary Care Plan Details Patient Name:  [name] Date of Service: 12/25/2021 9:00 AM Medical Record Number: [id number] Patient Account Number: [id number] Date of Birth/Sex: 01-21-1986 (36 y.o. M) Treating RN: Angelina Pih Primary Care Ramsie Ostrander: Cheryll Cockayne Other Clinician: Referring Maree Ainley: Cheryll Cockayne Treating Tammy Wickliffe/Extender: Allen Derry Weeks in Treatment: 5 Active Inactive Electronic Signature(s) Signed: 12/25/2021 9:33:24 AM By: Angelina Pih Entered By: Angelina Pih on 12/25/2021 09:33:24 ALASTOR, TIVNAN ([id number]) -------------------------------------------------------------------------------- Pain Assessment Details Patient Name: [name] Date of Service: 12/25/2021 9:00 AM Medical Record Number: [id number] Patient Account Number: [id number] Date of Birth/Sex: 1986-04-05 (36 y.o. M) Treating RN: Angelina Pih Primary Care Lashandra Arauz: Cheryll Cockayne Other Clinician: Referring Finlay Mills: Cheryll Cockayne Treating Ova Gillentine/Extender: Rowan Blase in Treatment: 5 Active Problems Location of Pain Severity and Description of Pain Patient Has Paino No Site Locations Rate the pain. Current Pain Level: 0 Pain Management and Medication Current Pain Management: Electronic  Signature(s) Signed: 12/25/2021 3:20:36 PM By: Angelina Pih Entered By: Angelina Pih on 12/25/2021 09:07:32 OLICE, PASSMAN ([id number]) -------------------------------------------------------------------------------- Patient/Caregiver Education Details Patient Name: [name] Date of Service: 12/25/2021 9:00 AM Medical Record Number: [id number] Patient Account Number: [id number] Date of Birth/Gender: [birth year] (36 y.o. M) Treating RN: Angelina Pih Primary Care Physician: Cheryll Cockayne Other Clinician: Referring Physician: Cheryll Cockayne Treating Physician/Extender: Rowan Blase in Treatment: 5 Education Assessment Education Provided To: Patient Education Topics Provided Wound/Skin Impairment: Handouts: Caring for Your Ulcer Methods: Explain/Verbal Responses: State content correctly Electronic Signature(s) Signed: 12/25/2021 3:20:36 PM By: Angelina Pih Entered By: Angelina Pih on 12/25/2021 09:30:15 [name] ([id number]) -------------------------------------------------------------------------------- Wound Assessment Details Patient Name: [name] Date of Service: 12/25/2021 9:00 AM Medical Record Number: [id number] Patient Account Number: [id number] Date of Birth/Sex: [birth year] (36 y.o. M) Treating RN: Angelina Pih Primary Care Mirela Parsley: Cheryll Cockayne Other Clinician: Referring Chaze Hruska: Cheryll Cockayne Treating Jahon Bart/Extender: Allen Derry Weeks in Treatment: 5 Wound Status Wound Number: 1 Primary Etiology: Pilonidal Cyst Wound Location: Midline Sacrum Wound Status: Open Wounding Event: Surgical Injury Date Acquired: 04/13/2021 Weeks Of Treatment: 5 Clustered Wound: No Photos Wound Measurements Length: (cm) 0 Width: (cm) 0 Depth: (cm) 0 Area: (cm) 0 Volume: (cm) 0 % Reduction in Area: 100% % Reduction in Volume: 100% Epithelialization: None Tunneling: No Undermining: No Wound Description Classification: Full Thickness Without  Exposed Support Structure Exudate Amount: None Present s Foul Odor After Cleansing: No Slough/Fibrino Yes Wound Bed Granulation Amount: None Present (0%) Exposed Structure Necrotic Amount: None Present (0%) Fat Layer (Subcutaneous Tissue) Exposed: No Electronic Signature(s) Signed: 12/25/2021 3:20:36 PM By: Angelina Pih Entered By: Angelina Pih on 12/25/2021 09:16:51 [name] ([id number]) -------------------------------------------------------------------------------- Vitals Details Patient Name: [name] Date of Service: 12/25/2021 9:00 AM Medical Record Number: [id number] Patient Account Number: [id number] Date of Birth/Sex: 02/19/1986 (36 y.o. M) Treating RN: Angelina Pih Primary Care Fonda Rochon: Cheryll Cockayne Other Clinician: Referring Rillie Riffel: Cheryll Cockayne Treating Raechel Marcos/Extender: Rowan Blase in Treatment: 5 Vital Signs Time Taken: 09:05 Temperature (F): 98.1 Height (in): 67 Pulse (bpm): 47 Weight (lbs): 255 Respiratory Rate (breaths/min): 18 Body Mass Index (BMI): 39.9 Blood Pressure (mmHg): 129/81 Reference Range: 80 - 120 mg / dl Electronic Signature(s) Signed: 12/25/2021 3:20:36 PM By: Angelina Pih Entered By: Angelina Pih on 12/25/2021 09:07:22

## 2021-12-25 NOTE — Progress Notes (Addendum)
[name], [name] ([id number]) Visit Report for 12/25/2021 Chief Complaint Document Details Patient Name: [name], [name] Date of Service: 12/25/2021 9:00 AM Medical Record Number: [id number] Patient Account Number: [id number] Date of Birth/Sex: [birth year] (36 y.o. M) Treating RN: Levora Dredge Primary Care Provider: Billey Gosling Other Clinician: Referring Provider: Billey Gosling Treating Provider/Extender: Skipper Cliche in Treatment: 5 Information Obtained from: Patient Chief Complaint Disruption of pilonidal cyst removal Electronic Signature(s) Signed: 12/25/2021 9:19:02 AM By: Worthy Keeler PA-C Entered By: Worthy Keeler on 12/25/2021 09:19:02 [place], [name] ([id number]) -------------------------------------------------------------------------------- HPI Details Patient Name: [name] Date of Service: 12/25/2021 9:00 AM Medical Record Number: [id number] Patient Account Number: [id number] Date of Birth/Sex: 1985/12/26 (36 y.o. M) Treating RN: Levora Dredge Primary Care Provider: Billey Gosling Other Clinician: Referring Provider: Billey Gosling Treating Provider/Extender: Skipper Cliche in Treatment: 5 History of Present Illness HPI Description: 11/20/2021 upon evaluation patient presents for initial inspection here in our clinic concerning a history of having had a pilonidal cyst summer 2022. Following that time he was told that it could take up to 6 months for this to heal therefore he is waited until now which was the 43-month mark to come in to be seen here at the wound care center. Unfortunately I am just not certain that this has been healing as well from an internal perspective it is what should be and to be honest it still draining but again there is significant depth to the wound that really has not been packed appropriately up to this point. I do believe that we do need to address that today. The good news is he is otherwise fairly healthy there is no signs of systemic  infection and locally the infection seems to be pretty well controlled at this time. I do not see significant purulent drainage although I do see quite a bit of drainage in the internal aspect of this is very friable currently. 11/27/2021 upon evaluation today patient appears to be doing about the same in regard to his wound. Fortunately I do not see anything that seems to be worse or truly infected but unfortunately he still is very friable and having a lot of bleeding even with just light cleaning using a Q-tip and gauze. 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 He does have some what appears to be potentially swelling although it may just be scarring to the side on the right of the gluteal region just lateral to the sacrum. His wife who is here today states that she feels like that has been that way since the surgery but she cannot be 100% sure. Nonetheless in general I am concerned about the fact that this probably needs to be changed more frequently I think even daily and they are getting start doing this at home on their own when she is good to help him. I do believe we may want to see about an ultrasound however to see if there is any evidence of an ongoing pilonidal cyst here. 12/04/2021 upon evaluation today patient actually appears to be doing better in regard to the original wound. He has his ultrasound scheduled for the end of this week. He may have to reschedule that however as it appears that he is managing may not be in network for him which is unfortunate. Nonetheless I am encouraged by what I am seeing today going to the wound in general. He does have some excoriation of the  below I thought initially this may have been drainage but I think it might have actually been when he was shaving that he actually caused some irritation and breakdown of this region. 12/11/2021 upon evaluation today patient appears to  actually be doing much better in regard to his wound. The area of irritation down below was actually looking better as well although it still definitely there. I do believe that he may be better to use something like zinc in between this region and then just use a gauze packed in behind the packing for the [facility]. Overall I think that his wife and he are doing a great job with this wound in general. In fact it is doing so well I do not believe the ultrasound is in good to be necessary is good considering that he cannot even get any solid answers from his insurance company about whether they will cover it or even if the location where this is scheduled is even in network. 12/25/2021 upon evaluation today patient appears to be doing excellent in fact he appears to be completely healed based on what we are seeing in. Fortunately there does not appear to be any evidence of active infection locally nor systemically at this time which is great news. Electronic Signature(s) Signed: 12/25/2021 9:55:15 AM By: Worthy Keeler PA-C Entered By: Worthy Keeler on 12/25/2021 09:55:15 [place], [name] ([id number]) -------------------------------------------------------------------------------- Physical Exam Details Patient Name: [name] Date of Service: 12/25/2021 9:00 AM Medical Record Number: [id number] Patient Account Number: [id number] Date of Birth/Sex: 1986-06-20 (36 y.o. M) Treating RN: Levora Dredge Primary Care Provider: Billey Gosling Other Clinician: Referring Provider: Billey Gosling Treating Provider/Extender: Jeri Cos Weeks in Treatment: 5 Constitutional Well-nourished and well-hydrated in no acute distress. Respiratory normal breathing without difficulty. Psychiatric this patient is able to make decisions and demonstrates good insight into disease process. Alert and Oriented x 3. pleasant and cooperative. Notes Upon inspection patient's wound bed showed evidence of good  epithelization currently. Fortunately I do not see any evidence of anything open. I think this is actually completely healed based on what I see. That is also news. Electronic Signature(s) Signed: 12/25/2021 9:55:36 AM By: Worthy Keeler PA-C Entered By: Worthy Keeler on 12/25/2021 09:55:35 [name], [name] ([id number]) -------------------------------------------------------------------------------- Physician Orders Details Patient Name: [name] Date of Service: 12/25/2021 9:00 AM Medical Record Number: [id number] Patient Account Number: [id number] Date of Birth/Sex: Jan 14, 1986 (36 y.o. M) Treating RN: Levora Dredge Primary Care Provider: Billey Gosling Other Clinician: Referring Provider: Billey Gosling Treating Provider/Extender: Skipper Cliche in Treatment: 5 Verbal / Phone Orders: No Diagnosis Coding Discharge From [facility] o Discharge from [facility] - apply desitin to area for protection and use a folded piece of silvercell for about another week. Can continue using desitin a bit longer after first week. Please call with any issues. Electronic Signature(s) Signed: 12/25/2021 3:20:36 PM By: Levora Dredge Signed: 12/25/2021 4:03:57 PM By: Worthy Keeler PA-C Entered By: Levora Dredge on 12/25/2021 09:21:29 [name], [name] ([id number]) -------------------------------------------------------------------------------- Problem List Details Patient Name: [name] Date of Service: 12/25/2021 9:00 AM Medical Record Number: [id number] Patient Account Number: [id number] Date of Birth/Sex: December 10, 1985 (36 y.o. M) Treating RN: Levora Dredge Primary Care Provider: Billey Gosling Other Clinician: Referring Provider: Billey Gosling Treating Provider/Extender: Skipper Cliche in Treatment: 5 Active Problems ICD-10 Encounter Code Description Active Date MDM Diagnosis T81.31XA Disruption of external operation (surgical) wound, not elsewhere 11/20/2021  No Yes classified, initial encounter L98.492 Non-pressure  chronic ulcer of skin of other sites with fat layer exposed 11/20/2021 No Yes L05.01 Pilonidal cyst with abscess 11/20/2021 No Yes Inactive Problems Resolved Problems Electronic Signature(s) Signed: 12/25/2021 9:18:56 AM By: Worthy Keeler PA-C Entered By: Worthy Keeler on 12/25/2021 09:18:55 [name], [name] ([id number]) -------------------------------------------------------------------------------- Progress Note Details Patient Name: [name] Date of Service: 12/25/2021 9:00 AM Medical Record Number: [id number] Patient Account Number: [id number] Date of Birth/Sex: May 22, 1986 (36 y.o. M) Treating RN: Levora Dredge Primary Care Provider: Billey Gosling Other Clinician: Referring Provider: Billey Gosling Treating Provider/Extender: Skipper Cliche in Treatment: 5 Subjective Chief Complaint Information obtained from Patient Disruption of pilonidal cyst removal History of Present Illness (HPI) 11/20/2021 upon evaluation patient presents for initial inspection here in our clinic concerning a history of having had a pilonidal cyst summer 2022. Following that time he was told that it could take up to 6 months for this to heal therefore he is waited until now which was the 56-month mark to come in to be seen here at the wound care center. Unfortunately I am just not certain that this has been healing as well from an internal perspective it is what should be and to be honest it still draining but again there is significant depth to the wound that really has not been packed appropriately up to this point. I do believe that we do need to address that today. The good news is he is otherwise fairly healthy there is no signs of systemic infection and locally the infection seems to be pretty well controlled at this time. I do not see significant purulent drainage although I do see quite a bit of drainage in the internal aspect of this is very  friable currently. 11/27/2021 upon evaluation today patient appears to be doing about the same in regard to his wound. Fortunately I do not see anything that seems to be worse or truly infected but unfortunately he still is very friable and having a lot of bleeding even with just light cleaning using a Q-tip and gauze. 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 He does have some what appears to be potentially swelling although it may just be scarring to the side on the right of the gluteal region just lateral to the sacrum. His wife who is here today states that she feels like that has been that way since the surgery but she cannot be 100% sure. Nonetheless in general I am concerned about the fact that this probably needs to be changed more frequently I think even daily and they are getting start doing this at home on their own when she is good to help him. I do believe we may want to see about an ultrasound however to see if there is any evidence of an ongoing pilonidal cyst here. 12/04/2021 upon evaluation today patient actually appears to be doing better in regard to the original wound. He has his ultrasound scheduled for the end of this week. He may have to reschedule that however as it appears that he is managing may not be in network for him which is unfortunate. Nonetheless I am encouraged by what I am seeing today going to the wound in general. He does have some excoriation of the below I thought initially this may have been drainage but I think it might have actually been when he was shaving that he actually caused some irritation and breakdown of this region.  12/11/2021 upon evaluation today patient appears to actually be doing much better in regard to his wound. The area of irritation down below was actually looking better as well although it still definitely there. I do believe that he may be better to use something like  zinc in between this region and then just use a gauze packed in behind the packing for the [facility]. Overall I think that his wife and he are doing a great job with this wound in general. In fact it is doing so well I do not believe the ultrasound is in good to be necessary is good considering that he cannot even get any solid answers from his insurance company about whether they will cover it or even if the location where this is scheduled is even in network. 12/25/2021 upon evaluation today patient appears to be doing excellent in fact he appears to be completely healed based on what we are seeing in. Fortunately there does not appear to be any evidence of active infection locally nor systemically at this time which is great news. Objective Constitutional Well-nourished and well-hydrated in no acute distress. Vitals Time Taken: 9:05 AM, Height: 67 in, Weight: 255 lbs, BMI: 39.9, Temperature: 98.1 F, Pulse: 47 bpm, Respiratory Rate: 18 breaths/min, Blood Pressure: 129/81 mmHg. Respiratory normal breathing without difficulty. Psychiatric this patient is able to make decisions and demonstrates good insight into disease process. Alert and Oriented x 3. pleasant and cooperative. General Notes: Upon inspection patient's wound bed showed evidence of good epithelization currently. Fortunately I do not see any evidence of anything open. I think this is actually completely healed based on what I see. That is also news. [name], [name] ([id number]) Integumentary (Hair, Skin) Wound #1 status is Open. Original cause of wound was Surgical Injury. The date acquired was: 04/13/2021. The wound has been in treatment 5 weeks. The wound is located on the Midline Sacrum. The wound measures 0cm length x 0cm width x 0cm depth; 0cm^2 area and 0cm^3 volume. There is no tunneling or undermining noted. There is a none present amount of drainage noted. There is no granulation within the wound bed. There is no  necrotic tissue within the wound bed. 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 Can continue using desitin a bit longer after first week. Please call with any issues. 1. Would recommend that we going continue with the wound care measures as before and the patient is in agreement with plan. This includes the use of the Desitin and the silver alginate just to keep things clean and dry. I think that still help protect the region he should do this for 2 weeks. After that is using a small amount thinly applied of Desitin would be ideal. We will see him back for a follow-up visit as needed going forward. Electronic Signature(s) Signed: 12/25/2021 9:56:07 AM By: Worthy Keeler PA-C Entered By: Worthy Keeler on 12/25/2021 09:56:07 [name], [name] ([id number]) -------------------------------------------------------------------------------- SuperBill Details Patient Name: [name] Date of Service: 12/25/2021 Medical Record Number: [id number] Patient Account Number: [id number] Date of Birth/Sex: December 09, 1985 (35 y.o. M) Treating RN: Levora Dredge Primary Care Provider: Billey Gosling Other Clinician: Referring Provider: Billey Gosling Treating Provider/Extender:  Jeri Cos Weeks in Treatment: 5 Diagnosis Coding ICD-10 Codes Code Description T81.31XA Disruption of external operation (surgical) wound, not elsewhere classified, initial encounter L98.492 Non-pressure chronic ulcer of skin of other sites with fat layer exposed L05.01 Pilonidal cyst with abscess Facility Procedures CPT4 Code: [id number] Description: [phone] - WOUND  CARE VISIT-LEV 2 EST PT Modifier: Quantity: 1 Physician Procedures CPT4 Code: [id number] Description: [id number] - WC PHYS LEVEL 3 - EST PT Modifier: Quantity: 1 CPT4 Code: Description: ICD-10 Diagnosis Description T81.31XA Disruption of external operation (surgical) wound, not elsewhere classifi L98.492 Non-pressure chronic ulcer of skin of other sites with fat layer exposed L05.01 Pilonidal cyst with abscess Modifier: ed, initial encounter Quantity: Electronic Signature(s) Signed: 12/25/2021 9:57:21 AM By: Worthy Keeler PA-C Previous Signature: 12/25/2021 9:29:39 AM Version By: Levora Dredge Entered By: Worthy Keeler on 12/25/2021 09:57:21

## 2022-01-01 ENCOUNTER — Encounter: Payer: BC Managed Care – PPO | Admitting: Physician Assistant

## 2022-01-08 ENCOUNTER — Encounter: Payer: BC Managed Care – PPO | Admitting: Physician Assistant

## 2022-01-15 ENCOUNTER — Encounter: Payer: BC Managed Care – PPO | Admitting: Physician Assistant

## 2022-11-13 ENCOUNTER — Encounter: Payer: BC Managed Care – PPO | Attending: Physician Assistant | Admitting: Physician Assistant

## 2022-11-13 DIAGNOSIS — L98492 Non-pressure chronic ulcer of skin of other sites with fat layer exposed: Secondary | ICD-10-CM | POA: Diagnosis not present

## 2022-11-13 DIAGNOSIS — L98422 Non-pressure chronic ulcer of back with fat layer exposed: Secondary | ICD-10-CM | POA: Diagnosis not present

## 2022-11-13 DIAGNOSIS — L0501 Pilonidal cyst with abscess: Secondary | ICD-10-CM | POA: Insufficient documentation

## 2022-11-15 NOTE — Progress Notes (Addendum)
[name] [name] ([id number]) [id number] Page 1 of 8 Visit Report for 11/13/2022 Allergy List Details Patient Name: Date of Service: [name] [name] 11/13/2022 8:45 A M Medical Record Number: [id number] Patient Account Number: [id number] Date of Birth/Sex: Treating RN: 1986/02/23 (37 y.o. Male) Cornell Barman Primary Care Myalynn Lingle: Billey Gosling Other Clinician: Referring Starletta Houchin: Treating Fatisha Rabalais/Extender: Rosemarie Beath Weeks in Treatment: 0 Allergies Active Allergies No Known Drug Allergies Allergy Notes Electronic Signature(s) Signed: 11/14/2022 5:47:34 PM By: Gretta Cool, BSN, RN, CWS, Kim RN, BSN Entered By: Gretta Cool, BSN, RN, CWS, Kim on 11/13/2022 09:18:03 -------------------------------------------------------------------------------- Arrival Information Details Patient Name: Date of Service: [name] [name] [name] [name] 11/13/2022 8:45 A M Medical Record Number: [id number] Patient Account Number: [id number] Date of Birth/Sex: Treating RN: 1986/02/07 (36 y.o. Male) Cornell Barman Primary Care Harlan Ervine: Billey Gosling Other Clinician: Referring Clennon Nasca: Treating Chenay Nesmith/Extender: Elenore Paddy in Treatment: 0 Visit Information Patient Arrived: Ambulatory Arrival Time: 09:16 Accompanied By: self Transfer Assistance: None Patient Identification Verified: Yes Secondary Verification Process Completed: Yes Patient Has Alerts: Yes Patient Alerts: NOT DIABETIC History Since Last Visit Electronic Signature(s) Signed: 11/14/2022 5:47:34 PM By: Gretta Cool, BSN, RN, CWS, Kim RN, BSN Entered By: Gretta Cool, BSN, RN, CWS, Kim on 11/13/2022 [place], Rachel [name] ([id number]) [id number] Page 2 of 8 -------------------------------------------------------------------------------- Clinic Level of Care Assessment Details Patient Name: Date of Service: [name] [name] 11/13/2022 8:45 A M Medical Record Number: [id number] Patient Account Number:  [id number] Date of Birth/Sex: Treating RN: [birth year] (37 y.o. Male) Cornell Barman Primary Care Anyelo Mccue: Billey Gosling Other Clinician: Referring Jesika Men: Treating Keatyn Jawad/Extender: Rosemarie Beath Weeks in Treatment: 0 Clinic Level of Care Assessment Items TOOL 2 Quantity Score []  - 0 Use when only an EandM is performed on the INITIAL visit ASSESSMENTS - Nursing Assessment / Reassessment X- 1 20 General Physical Exam (combine w/ comprehensive assessment (listed just below) when performed on new pt. evals) X- 1 25 Comprehensive Assessment (HX, ROS, Risk Assessments, Wounds Hx, etc.) ASSESSMENTS - Wound and Skin A ssessment / Reassessment X - Simple Wound Assessment / Reassessment - one wound 1 5 []  - 0 Complex Wound Assessment / Reassessment - multiple wounds []  - 0 Dermatologic / Skin Assessment (not related to wound area) ASSESSMENTS - Ostomy and/or Continence Assessment and Care []  - 0 Incontinence Assessment and Management []  - 0 Ostomy Care Assessment and Management (repouching, etc.) PROCESS - Coordination of Care X - Simple Patient / Family Education for ongoing care 1 15 []  - 0 Complex (extensive) Patient / Family Education for ongoing care X- 1 10 Staff obtains Programmer, systems, Records, T Results / Process Orders est []  - 0 Staff telephones HHA, Nursing Homes / Clarify orders / etc []  - 0 Routine Transfer to another Facility (non-emergent condition) []  - 0 Routine Hospital Admission (non-emergent condition) []  - 0 New Admissions / Biomedical engineer / Ordering NPWT Apligraf, etc. , []  - 0 Emergency Hospital Admission (emergent condition) X- 1 10 Simple Discharge Coordination []  - 0 Complex (extensive) Discharge Coordination PROCESS - Special Needs []  - 0 Pediatric / Minor Patient Management []  - 0 Isolation Patient Management []  - 0 Hearing / Language / Visual special needs []  - 0 Assessment of Community assistance (transportation, D/C planning,  etc.) []  - 0 Additional assistance / Altered mentation []  - 0 Support Surface(s) Assessment (bed, cushion, seat, etc.) INTERVENTIONS - Wound Cleansing / Measurement X- 1 5 Wound Imaging (photographs - any number of wounds) [name] [name] ([id number]) [phone].pdf Page 3 of 8 []  -  0 Wound Tracing (instead of photographs) X- 1 5 Simple Wound Measurement - one wound []  - 0 Complex Wound Measurement - multiple wounds X- 1 5 Simple Wound Cleansing - one wound []  - 0 Complex Wound Cleansing - multiple wounds INTERVENTIONS - Wound Dressings []  - 0 Small Wound Dressing one or multiple wounds X- 1 15 Medium Wound Dressing one or multiple wounds []  - 0 Large Wound Dressing one or multiple wounds []  - 0 Application of Medications - injection INTERVENTIONS - Miscellaneous []  - 0 External ear exam []  - 0 Specimen Collection (cultures, biopsies, blood, body fluids, etc.) []  - 0 Specimen(s) / Culture(s) sent or taken to Lab for analysis []  - 0 Patient Transfer (multiple staff / / Similar devices) []  - 0 Simple Staple / Suture removal (25 or less) []  - 0 Complex Staple / Suture removal (26 or more) []  - 0 Hypo / Hyperglycemic Management (close monitor of Blood Glucose) []  - 0 Ankle / Brachial Index (ABI) - do not check if billed separately Has the patient been seen at the hospital within the last three years: Yes Total Score: 115 Level Of Care: New/Established - Level 3 Electronic Signature(s) Signed: 11/15/2022 3:12:14 PM By: , BSN, RN, CWS, Kim RN, BSN Entered By: , BSN, RN, CWS, Kim on 11/15/2022 13:57:14 -------------------------------------------------------------------------------- Lower Extremity Assessment Details Patient Name: Date of Service: [name] [name] [name] [name] 11/13/2022 8:45 A M Medical Record Number: Patient Account Number: Date of Birth/Sex: Treating RN: May 02, 1986 (37 y.o. Male) Primary  Care Shakinah Navis: Other Clinician: Referring Bethzaida Boord: Treating Amere Iott/Extender: Weeks in Treatment: 0 Electronic Signature(s) Signed: 11/14/2022 5:47:34 PM By: Elliot Gurney, BSN, RN, CWS, Kim RN, BSN Entered By: Elliot Gurney, BSN, RN, CWS, Kim on 11/13/2022 09:17:59 [place], 11/15/2022 ([id number]) [id number] Page 4 of 8 -------------------------------------------------------------------------------- Multi Wound Chart Details Patient Name: Date of Service: [name] [name] [name] [name] 11/13/2022 8:45 A M Medical Record Number: [id number] Patient Account Number: Huel Coventry Date of Birth/Sex: Treating RN: [birth year] (37 y.o. Male) 11/16/2022 Primary Care Arma Reining: Elliot Gurney Other Clinician: Referring Deepika Decatur: Treating Helio Lack/Extender: Elliot Gurney Weeks in Treatment: 0 Vital Signs Height(in): 67 Pulse(bpm): 72 Weight(lbs): 250 Blood Pressure(mmHg): 141/83 Body Mass Index(BMI): 39.2 Temperature(F): 98.1 Respiratory Rate(breaths/min): 16 [2:Photos:] [N/A:N/A] Midline Sacrum N/A N/A Wound Location: Gradually Appeared N/A N/A Wounding Event: Pilonidal Cyst N/A N/A Primary Etiology: 07/22/2018 N/A N/A Date Acquired: 0 N/A N/A Weeks of Treatment: Open N/A N/A Wound Status: No N/A N/A Wound Recurrence: 1x0.5x1 N/A N/A Measurements L x W x D (cm) 0.393 N/A N/A A (cm) : rea 0.393 N/A N/A Volume (cm) : 12 Position 1 (o'clock): 1 Maximum Distance 1 (cm): Yes N/A N/A Tunneling: Full Thickness Without Exposed N/A N/A Classification: Support Structures Medium N/A N/A Exudate Amount: Sanguinous N/A N/A Exudate Type: red N/A N/A Exudate Color: Large (67-100%) N/A N/A Granulation Amount: Red, Friable N/A N/A Granulation Quality: Small (1-33%) N/A N/A Necrotic Amount: Fat Layer (Subcutaneous Tissue): Yes N/A N/A Exposed Structures: Fascia: No Tendon: No Muscle: No Joint: No Bone: No Treatment Notes Sanson,  Filippo ([place] cityAmalia Hailey.pdf Page 5 of 8 Electronic Signature(s) Signed: 11/15/2022 1:52:33 PM By: Farrel Gobble, BSN, RN, CWS, Kim RN, BSN Previous Signature: 11/14/2022 5:47:34 PM Version By: [id number], BSN, RN, CWS, Kim RN, BSN Entered By: [id number], BSN, RN, CWS, Kim on 11/15/2022 13:52:33 -------------------------------------------------------------------------------- Pain Assessment Details Patient Name: Date of Service: [name] [name] [name] [name] 11/13/2022 8:45 A M Medical Record Number: Cheryll Cockayne Patient Account Number:  [id number] Date of Birth/Sex: Treating RN: 07/03/1986 (37 y.o. Male) Cornell Barman Primary Care Gale Hulse: Billey Gosling Other Clinician: Referring Damascus Feldpausch: Treating Jesse Nosbisch/Extender: Rosemarie Beath Weeks in Treatment: 0 Active Problems Location of Pain Severity and Description of Pain Patient Has Paino No Site Locations Pain Management and Medication Current Pain Management: Notes Patient denies pain at this time. Electronic Signature(s) Signed: 11/14/2022 5:47:34 PM By: Gretta Cool, BSN, RN, CWS, Kim RN, BSN Entered By: Gretta Cool, BSN, RN, CWS, Kim on 11/13/2022 09:16:48 [name] ([id number]) [id number] Page 6 of 8 -------------------------------------------------------------------------------- Patient/Caregiver Education Details Patient Name: Date of Service: [name] [name] [name] [name] 1/23/2024andnbsp8:45 [place] Record Number: [id number] Patient Account Number: [id number] Date of Birth/Gender: Treating RN: [birth year] (37 y.o. Male) Cornell Barman Primary Care Physician: Billey Gosling Other Clinician: Referring Physician: Treating Physician/Extender: Elenore Paddy in Treatment: 0 Education Assessment Education Provided To: Patient Education Topics Provided Wound/Skin Impairment: Handouts: Caring for Your Ulcer, Other: wound care as prescribed Methods: Explain/Verbal Responses: State content  correctly Electronic Signature(s) Signed: 11/15/2022 3:12:14 PM By: Gretta Cool, BSN, RN, CWS, Kim RN, BSN Entered By: Gretta Cool, BSN, RN, CWS, Kim on 11/15/2022 13:57:43 -------------------------------------------------------------------------------- Wound Assessment Details Patient Name: Date of Service: [name] [name] [name] [name] 11/13/2022 8:45 A M Medical Record Number: [id number] Patient Account Number: [id number] Date of Birth/Sex: Treating RN: [birth year] (36 y.o. Male) Cornell Barman Primary Care Loura Pitt: Billey Gosling Other Clinician: Referring Heidi Lemay: Treating Denim Start/Extender: Rosemarie Beath Weeks in Treatment: 0 Wound Status Wound Number: 2 Primary Etiology: Pilonidal Cyst Wound Location: Midline Sacrum Wound Status: Open Wounding Event: Gradually Appeared Date Acquired: 07/22/2018 Weeks Of Treatment: 0 Clustered Wound: No Photos [place], Rachel [name] ([id number]) [id number] Page 7 of 8 Wound Measurements Length: (cm) 1 Width: (cm) 0.5 Depth: (cm) 1 Area: (cm) 0.393 Volume: (cm) 0.393 % Reduction in Area: % Reduction in Volume: Tunneling: Yes Position (o'clock): 12 Maximum Distance: (cm) 1 Wound Description Classification: Full Thickness Without Exposed Suppor Exudate Amount: Medium Exudate Type: Sanguinous Exudate Color: red t Structures Foul Odor After Cleansing: No Slough/Fibrino No Wound Bed Granulation Amount: Large (67-100%) Exposed Structure Granulation Quality: Red, Friable Fascia Exposed: No Necrotic Amount: Small (1-33%) Fat Layer (Subcutaneous Tissue) Exposed: Yes Necrotic Quality: Adherent Slough Tendon Exposed: No Muscle Exposed: No Joint Exposed: No Bone Exposed: No Electronic Signature(s) Signed: 11/15/2022 2:00:54 PM By: Gretta Cool, BSN, RN, CWS, Kim RN, BSN Previous Signature: 11/14/2022 5:47:34 PM Version By: Gretta Cool, BSN, RN, CWS, Kim RN, BSN Entered By: Gretta Cool, BSN, RN, CWS, Kim on 11/15/2022  14:00:54 -------------------------------------------------------------------------------- Vitals Details Patient Name: Date of Service: [name] [name] [name] [name] 11/13/2022 8:45 A M Medical Record Number: [id number] Patient Account Number: [id number] Date of Birth/Sex: Treating RN: January 12, 1986 (36 y.o. Male) Cornell Barman Primary Care Saramarie Stinger: Billey Gosling Other Clinician: Referring Eily Louvier: Treating Tayvien Kane/Extender: Rosemarie Beath Weeks in Treatment: 0 Vital Signs Time Taken: 09:16 Temperature (F): 98.1 Height (in): 67 Pulse (bpm): 72 Weight (lbs): 250 Respiratory Rate (breaths/min): 16 Body Mass Index (BMI): 39.2 Blood Pressure (mmHg): 141/83 Reference Range: 80 - 120 mg / dl Electronic Signature(s) [place], Jhonatan ([id number]) [phone].pdf Page 8 of 8 Signed: 11/14/2022 5:47:34 PM By: Gretta Cool, BSN, RN, CWS, Kim RN, BSN Entered By: Gretta Cool, BSN, RN, CWS, Kim on 11/13/2022 09:17:50

## 2022-11-15 NOTE — Progress Notes (Addendum)
OFFIE, WAIDE ([id number]) [id number] Page 1 of 10 Visit Report for 11/13/2022 Chief Complaint Document Details Patient Name: Date of Service: [name], [name] 11/13/2022 8:45 A M Medical Record Number: [id number] Patient Account Number: [id number] Date of Birth/Sex: Treating RN: 05/29/1986 (37 y.o. Male) Huel Coventry Primary Care Provider: Cheryll Cockayne Other Clinician: Referring Provider: Treating Provider/Extender: Jenne Campus Weeks in Treatment: 0 Patient / Caregiver unable to provide history. Chief Complaint Disruption of pilonidal cyst removal Electronic Signature(s) Signed: 11/13/2022 9:47:14 AM By: Lenda Kelp PA-C Entered By: Lenda Kelp on 11/13/2022 09:47:14 -------------------------------------------------------------------------------- Debridement Details Patient Name: Date of Service: [name], Mckay 11/13/2022 8:45 A M Medical Record Number: [id number] Patient Account Number: [id number] Date of Birth/Sex: Treating RN: 1986/01/03 (36 y.o. Male) Huel Coventry Primary Care Provider: Cheryll Cockayne Other Clinician: Referring Provider: Treating Provider/Extender: Jenne Campus Weeks in Treatment: 0 Debridement Performed for Assessment: Wound #2 Midline Sacrum Performed By: Physician Nelida Meuse., PA-C Debridement Type: Chemical/Enzymatic/Mechanical Agent Used: saline and gauze Level of Consciousness (Pre-procedure): Awake and Alert Pre-procedure Verification/Time Out Yes - 10:30 Taken: Instrument: Other : saline and gauze Bleeding: Minimum Hemostasis Achieved: Pressure Response to Treatment: Procedure was tolerated well Level of Consciousness (Post- Awake and Alert procedure): Post Debridement Measurements of Total Wound Length: (cm) 1 Width: (cm) 0.5 Depth: (cm) 1 [name] ([id number]) [id number] Page 2 of 10 Volume: (cm) 0.393 Character of Wound/Ulcer Post Debridement:  Stable Post Procedure Diagnosis Same as Pre-procedure Electronic Signature(s) Signed: 11/15/2022 2:00:29 PM By: Elliot Gurney, BSN, RN, CWS, Kim RN, BSN Signed: 11/16/2022 2:17:33 PM By: Lenda Kelp PA-C Entered By: Elliot Gurney, BSN, RN, CWS, Kim on 11/15/2022 14:00:29 -------------------------------------------------------------------------------- HPI Details Patient Name: Date of Service: [name] 11/13/2022 8:45 A M Medical Record Number: [id number] Patient Account Number: [id number] Date of Birth/Sex: Treating RN: 1986/09/26 (36 y.o. Male) Huel Coventry Primary Care Provider: Cheryll Cockayne Other Clinician: Referring Provider: Treating Provider/Extender: Jenne Campus Weeks in Treatment: 0 History of Present Illness HPI Description: 11/20/2021 upon evaluation patient presents for initial inspection here in our clinic concerning a history of having had a pilonidal cyst summer 2022. Following that time he was told that it could take up to 6 months for this to heal therefore he is waited until now which was the 35-month mark to come in to be seen here at the wound care center. Unfortunately I am just not certain that this has been healing as well from an internal perspective it is what should be and to be honest it still draining but again there is significant depth to the wound that really has not been packed appropriately up to this point. I do believe that we do need to address that today. The good news is he is otherwise fairly healthy there is no signs of systemic infection and locally the infection seems to be pretty well controlled at this time. I do not see significant purulent drainage although I do see quite a bit of drainage in the internal aspect of this is very friable currently. 11/27/2021 upon evaluation today patient appears to be doing about the same in regard to his wound. Fortunately I do not see anything that seems to be worse or truly infected but unfortunately he still  is very friable and having a lot of bleeding even with just light cleaning using a Q-tip and gauze. 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  He does have some what appears to be potentially swelling although it may just be scarring to the side on the right of the gluteal region just lateral to the sacrum. His wife who is here today states that she feels like that has been that way since the surgery but she cannot be 100% sure. Nonetheless in general I am concerned about the fact that this probably needs to be changed more frequently I think even daily and they are getting start doing this at home on their own when she is good to help him. I do believe we may want to see about an ultrasound however to see if there is any evidence of an ongoing pilonidal cyst here. 12/04/2021 upon evaluation today patient actually appears to be doing better in regard to the original wound. He has his ultrasound scheduled for the end of this week. He may have to reschedule that however as it appears that he is managing may not be in network for him which is unfortunate. Nonetheless I am encouraged by what I am seeing today going to the wound in general. He does have some excoriation of the below I thought initially this may have been drainage but I think it might have actually been when he was shaving that he actually caused some irritation and breakdown of this region. 12/11/2021 upon evaluation today patient appears to actually be doing much better in regard to his wound. The area of irritation down below was actually looking better as well although it still definitely there. I do believe that he may be better to use something like zinc in between this region and then just use a gauze packed in behind the packing for the [facility]. Overall I think that his wife and he are doing a great job with this wound in general. In fact it is doing  so well I do not believe the ultrasound is in good to be necessary is good considering that he cannot even get any solid answers from his insurance company about whether they will cover it or even if the location where this is scheduled is even in network. 12/25/2021 upon evaluation today patient appears to be doing excellent in fact he appears to be completely healed based on what we are seeing in. Fortunately there does not appear to be any evidence of active infection locally nor systemically at this time which is great news. Readmission: 11-13-2022 upon evaluation today patient presents for reevaluation here in the clinic due to issues that he is having with the area of pilonidal cyst that previously was removed and evacuated in the summer 2022. I initially had seen him last year in January 2023. At that point there was a question as to whether or not they had gotten everything out or if this was continuing to be an issue as far as the pilonidal cyst was concerned. Subsequently we had a discussion and we did treat him along the way and the patient tells me that he is did stay closed from the time I discharged him in March until sometime right around the beginning of summer. Nonetheless it has been open and draining again since that time he is obviously pretty frustrated with the situation which I can completely understand. He does not really want to see the previous surgeon that did this he would like to see somebody different as it appears is probably can either repeat surgery. He actually had some hair that was removed from the wound opening today  that we actually took pictures of it as well this had actually been embedded down in the wound and when the nurse was cleaning and changing the dressing she actually was able to remove these with tweezers. Nonetheless the patient has a picture of this we also took a picture for our records here in the clinic as well. Either way I do believe that the  patient is can require probably surgical intervention here and I am to see about referring him to either Dr. Maia Planintron or Dr. Tonna BoehringerSakai and see if there is anything they can do to help him out. He is in agreement with this plan and is greatly appreciative. Nash ShearerCOLLINS, Chane ([id number]) [id number] Page 3 of 10 Electronic Signature(s) Signed: 11/13/2022 10:42:29 AM By: Lenda KelpStone III, Arionne Iams PA-C Entered By: Lenda KelpStone III, Ricco Dershem on 11/13/2022 10:42:29 -------------------------------------------------------------------------------- Physical Exam Details Patient Name: Date of Service: Farrel GobbleCO LLINS, Damarion 11/13/2022 8:45 A M Medical Record Number: [id number] Patient Account Number: [id number] Date of Birth/Sex: Treating RN: Mar 23, 1986 (37 y.o. Male) Huel CoventryWoody, Kim Primary Care Provider: Cheryll CockayneBurns, Stacy Other Clinician: Referring Provider: Treating Provider/Extender: Jenne CampusStone, Arvon Schreiner Burns, Stacy Weeks in Treatment: 0 Constitutional sitting or standing blood pressure is within target range for patient.. pulse regular and within target range for patient.Marland Kitchen. respirations regular, non-labored and within target range for patient.Marland Kitchen. temperature within target range for patient.. Well-nourished and well-hydrated in no acute distress. Eyes conjunctiva clear no eyelid edema noted. pupils equal round and reactive to light and accommodation. Ears, Nose, Mouth, and Throat no gross abnormality of ear auricles or external auditory canals. normal hearing noted during conversation. mucus membranes moist. Respiratory normal breathing without difficulty. Musculoskeletal normal gait and posture. no significant deformity or arthritic changes, no loss or range of motion, no clubbing. Psychiatric this patient is able to make decisions and demonstrates good insight into disease process. Alert and Oriented x 3. pleasant and cooperative. Notes Upon inspection patient did have an opening that was very similar to what I  previously saw when we were treating and taking care of him. Subsequently I do believe that he would benefit from an evaluation with a general surgeon to see if they could go ahead and remove this as I feel like the pilonidal cyst is not completely cleared and subsequently he did have a pair that actually was removed from the opening of the wound today and this seems to indicate to be an ongoing issue here. He voiced agreement with the plan as far as seeing the surgeon and though he wants this done and wishes that was not the case he understands completely that he does need to see somebody at this point. Electronic Signature(s) Signed: 11/13/2022 10:43:22 AM By: Lenda KelpStone III, Taivon Haroon PA-C Entered By: Lenda KelpStone III, Trinh Sanjose on 11/13/2022 10:43:22 -------------------------------------------------------------------------------- Physician Orders Details Patient Name: Date of Service: [name] ShutterO LLINS, Raunak 11/13/2022 8:45 A M Medical Record Number: [id number] Patient Account Number: [id number] Date of Birth/Sex: Treating RN: Mar 23, 1986 (37 y.o. 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 Anesthetic (Use 'Patient  Medications' Section for Anesthetic Order Entry) Lidocaine applied to wound bed Wound Treatment Wound #2 - Sacrum Wound Laterality: Midline Prim Dressing: Silvercel Small 2x2  (in/in) ary Discharge Instructions: Apply Silvercel Small 2x2 (in/in) as instructed Consults General Surgery - Evaluation and treatment for ongoing pilonidal cyst needing surgical intervention. 1.5 inch hairs removed from wound today. - (ICD10 L05.01 - Pilonidal cyst with abscess) Electronic Signature(s) Signed: 11/15/2022 3:12:14 PM By: Elliot Gurney, BSN, RN, CWS, Kim RN, BSN Signed: 11/16/2022 2:17:33 PM By: Lenda Kelp PA-C Previous Signature: 11/15/2022 1:56:45 PM Version By: Elliot Gurney, BSN, RN, CWS, Kim RN, BSN Entered By: Elliot Gurney, BSN, RN, CWS, Kim on 11/15/2022 14:02:11 Prescription 11/13/2022 -------------------------------------------------------------------------------- Stevie Kern PA-C Patient Name: Provider: 03-Jan-1986 [id number] Date of Birth: NPI#: Male [id number] Sex: DEA #: [phone] Phone #: License #: [facility] Patient Address: [address] [facility] [place] [facility] [place], [place] [address] [address] [place], [place] [address] [phone] Allergies No Known Drug Allergies Provider's Orders General Surgery - ICD10: L05.01 - Evaluation and treatment for ongoing pilonidal cyst needing surgical intervention. 1.5 inch hairs removed from [place], Levy ([id number]) [id number] Page 5 of 10 wound today. Hand Signature: Date(s): Electronic Signature(s) Signed: 11/15/2022 3:12:14 PM By: Elliot Gurney, BSN, RN, CWS, Kim RN, BSN Signed: 11/16/2022 2:17:33 PM By: Lenda Kelp PA-C Entered By: Elliot Gurney, BSN, RN, CWS, Kim on 11/15/2022 14:02:11 -------------------------------------------------------------------------------- Problem List Details Patient Name: Date of Service: ZYON, ROSSER 11/13/2022 8:45 A M Medical Record Number: [id number] Patient Account Number: [id number] Date of Birth/Sex: Treating RN: 11-19-1985 (36 y.o. Male) Huel Coventry Primary Care Provider: Cheryll Cockayne  Other Clinician: Referring Provider: Treating Provider/Extender: Jenne Campus Weeks in Treatment: 0 Active Problems ICD-10 Encounter Code Description Active Date MDM Diagnosis L98.492 Non-pressure chronic ulcer of skin of other sites with fat layer exposed 11/13/2022 No Yes L05.01 Pilonidal cyst with abscess 11/13/2022 No Yes Inactive Problems Resolved Problems Electronic Signature(s) Signed: 11/13/2022 9:34:35 AM By: Lenda Kelp PA-C Entered By: Lenda Kelp on 11/13/2022 09:34:35 -------------------------------------------------------------------------------- Progress Note Details Patient Name: Date of Service: [name], Elaine 11/13/2022 8:45 A M Medical Record Number: [id number] Patient Account Number: [id number] ZAKYE, BABY ([id number]) [id number] Page 6 of 10 Date of Birth/Sex: Treating RN: 27-Nov-1985 (37 y.o. Male) Huel Coventry Primary Care Provider: Other Clinician: Cheryll Cockayne Referring Provider: Treating Provider/Extender: Jenne Campus Weeks in Treatment: 0 Subjective Chief Complaint Patient / Caregiver unable to provide history Disruption of pilonidal cyst removal History of Present Illness (HPI) 11/20/2021 upon evaluation patient presents for initial inspection here in our clinic concerning a history of having had a pilonidal cyst summer 2022. Following that time he was told that it could take up to 6 months for this to heal therefore he is waited until now which was the 76-month mark to come in to be seen here at the wound care center. Unfortunately I am just not certain that this has been healing as well from an internal perspective it is what should be and to be honest it still draining but again there is significant depth to the wound that really has not been packed appropriately up to this point. I do believe that we do need to address that today. The good news is he is otherwise fairly healthy there is no  signs of systemic infection and locally the infection seems to be pretty well controlled at this time. I do not see significant purulent drainage although  I do see quite a bit of drainage in the internal aspect of this is very friable currently. 11/27/2021 upon evaluation today patient appears to be doing about the same in regard to his wound. Fortunately I do not see anything that seems to be worse or truly infected but unfortunately he still is very friable and having a lot of bleeding even with just light cleaning using a Q-tip and gauze. 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 He does have some what appears to be potentially swelling although it may just be scarring to the side on the right of the gluteal region just lateral to the sacrum. His wife who is here today states that she feels like that has been that way since the surgery but she cannot be 100% sure. Nonetheless in general I am concerned about the fact that this probably needs to be changed more frequently I think even daily and they are getting start doing this at home on their own when she is good to help him. I do believe we may want to see about an ultrasound however to see if there is any evidence of an ongoing pilonidal cyst here. 12/04/2021 upon evaluation today patient actually appears to be doing better in regard to the original wound. He has his ultrasound scheduled for the end of this week. He may have to reschedule that however as it appears that he is managing may not be in network for him which is unfortunate. Nonetheless I am encouraged by what I am seeing today going to the wound in general. He does have some excoriation of the below I thought initially this may have been drainage but I think it might have actually been when he was shaving that he actually caused some irritation and breakdown of this region. 12/11/2021 upon evaluation today patient  appears to actually be doing much better in regard to his wound. The area of irritation down below was actually looking better as well although it still definitely there. I do believe that he may be better to use something like zinc in between this region and then just use a gauze packed in behind the packing for the [facility]. Overall I think that his wife and he are doing a great job with this wound in general. In fact it is doing so well I do not believe the ultrasound is in good to be necessary is good considering that he cannot even get any solid answers from his insurance company about whether they will cover it or even if the location where this is scheduled is even in network. 12/25/2021 upon evaluation today patient appears to be doing excellent in fact he appears to be completely healed based on what we are seeing in. Fortunately there does not appear to be any evidence of active infection locally nor systemically at this time which is great news. Readmission: 11-13-2022 upon evaluation today patient presents for reevaluation here in the clinic due to issues that he is having with the area of pilonidal cyst that previously was removed and evacuated in the summer 2022. I initially had seen him last year in January 2023. At that point there was a question as to whether or not they had gotten everything out or if this was continuing to be an issue as far as the pilonidal cyst was concerned. Subsequently we had a discussion and we did treat him along the way and  the patient tells me that he is did stay closed from the time I discharged him in March until sometime right around the beginning of summer. Nonetheless it has been open and draining again since that time he is obviously pretty frustrated with the situation which I can completely understand. He does not really want to see the previous surgeon that did this he would like to see somebody different as it appears is probably can either  repeat surgery. He actually had some hair that was removed from the wound opening today that we actually took pictures of it as well this had actually been embedded down in the wound and when the nurse was cleaning and changing the dressing she actually was able to remove these with tweezers. Nonetheless the patient has a picture of this we also took a picture for our records here in the clinic as well. Either way I do believe that the patient is can require probably surgical intervention here and I am to see about referring him to either Dr. Peyton Najjar or Dr. Lysle Pearl and see if there is anything they can do to help him out. He is in agreement with this plan and is greatly appreciative. Patient History Information obtained from Patient. Allergies No Known Drug Allergies Social History Former smoker - ended on 10/22/2010, Marital Status - Married, Alcohol Use - Moderate, Drug Use - No History, Caffeine Use - Daily - coffee. Review of Systems (ROS) Constitutional Symptoms (General Health) Denies complaints or symptoms of Fatigue, Fever, Chills, Marked Weight Change. Eyes Denies complaints or symptoms of Dry Eyes, Vision Changes, Glasses / Contacts. Ear/Nose/Mouth/Throat Denies complaints or symptoms of Difficult clearing ears, Sinusitis. Hematologic/Lymphatic Denies complaints or symptoms of Bleeding / Clotting Disorders, Human Immunodeficiency Virus. Respiratory Denies complaints or symptoms of Chronic or frequent coughs, Shortness of Breath. Cardiovascular Denies complaints or symptoms of Chest pain, LE edema. Gastrointestinal Denies complaints or symptoms of Frequent diarrhea, Nausea, Vomiting. Endocrine Denies complaints or symptoms of Hepatitis, Thyroid disease, Polydypsia (Excessive Thirst). Genitourinary Denies complaints or symptoms of Kidney failure/ Dialysis, Incontinence/dribbling. Immunological Denies complaints or symptoms of Hives, Itching. Integumentary (Skin) [place], Severin  ([id number]) [id number] Page 7 of 10 Complains or has symptoms of Wounds. Musculoskeletal Denies complaints or symptoms of Muscle Pain, Muscle Weakness. Neurologic Denies complaints or symptoms of Numbness/parasthesias, Focal/Weakness. Psychiatric Denies complaints or symptoms of Anxiety, Claustrophobia. Objective Constitutional sitting or standing blood pressure is within target range for patient.. pulse regular and within target range for patient.Marland Kitchen respirations regular, non-labored and within target range for patient.Marland Kitchen temperature within target range for patient.. Well-nourished and well-hydrated in no acute distress. Vitals Time Taken: 9:16 AM, Height: 67 in, Weight: 250 lbs, BMI: 39.2, Temperature: 98.1 F, Pulse: 72 bpm, Respiratory Rate: 16 breaths/min, Blood Pressure: 141/83 mmHg. Eyes conjunctiva clear no eyelid edema noted. pupils equal round and reactive to light and accommodation. Ears, Nose, Mouth, and Throat no gross abnormality of ear auricles or external auditory canals. normal hearing noted during conversation. mucus membranes moist. Respiratory normal breathing without difficulty. Musculoskeletal normal gait and posture. no significant deformity or arthritic changes, no loss or range of motion, no clubbing. Psychiatric this patient is able to make decisions and demonstrates good insight into disease process. Alert and Oriented x 3. pleasant and cooperative. General Notes: Upon inspection patient did have an opening that was very similar to what I previously saw when we were treating and taking care of him. Subsequently I do believe that he would benefit from an evaluation with  a general surgeon to see if they could go ahead and remove this as I feel like the pilonidal cyst is not completely cleared and subsequently he did have a pair that actually was removed from the opening of the wound today and this seems to indicate to be an ongoing issue  here. He voiced agreement with the plan as far as seeing the surgeon and though he wants this done and wishes that was not the case he understands completely that he does need to see somebody at this point. Integumentary (Hair, Skin) Wound #2 status is Open. Original cause of wound was Gradually Appeared. The date acquired was: 07/22/2018. The wound is located on the Midline Sacrum. The wound measures 1cm length x 0.5cm width x 1cm depth; 0.393cm^2 area and 0.393cm^3 volume. There is Fat Layer (Subcutaneous Tissue) exposed. There is tunneling at 12:00 with a maximum distance of 1cm. There is a medium amount of sanguinous drainage noted. There is large (67-100%) red, friable granulation within the wound bed. There is a small (1-33%) amount of necrotic tissue within the wound bed including Adherent Slough. 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 Based on what I am seeing currently I am can make referral to general surgery to see if they can go ahead and surgically correct this issue as it is now an ongoing problem for him that has been present for the better part of 2 solid years that I have known about although this was even going on prior to that. He would prefer to see a different surgeon therefore I am going to send him to the office for either Dr. Peyton Najjar or Dr. Lysle Pearl to see as I think both of them are excellent physicians as far as the surgical aspect is concerned. I would definitely trust their opinion in regard to what may or may not need to be done here. 2. I did have the patient take a picture as well of the hair which was removed from the wound opening. Again this was down in the wound not just laying on top of. I do believe that this is an indication that the pilonidal cyst has not completely cleared and will see subsequently how this continues to do until he has a chance to see the  surgeons. 3. Will seeing him just for a consult only today and no additional follow-ups are planned or recommended we did give him some of the silver alginate today to continue with dressing changes until he is able to see the surgical specialist. We will see him back for a follow-up visit as needed. Electronic Signature(s) [place], [place] ([id number]) [id number] Page 8 of 10 Signed: 11/13/2022 10:45:42 AM By: Worthy Keeler PA-C Entered By: Worthy Keeler on 11/13/2022 10:45:41 -------------------------------------------------------------------------------- ROS/PFSH Details Patient Name: Date of Service: Darnelle Going, [place] 11/13/2022 8:45 A M Medical Record Number: [id number] Patient Account Number: [id number] Date of Birth/Sex: Treating RN: 11-Jun-1986 (37 y.o. Male) Cornell Barman Primary Care Provider: Billey Gosling Other Clinician: Referring Provider: Treating Provider/Extender: Rosemarie Beath Weeks in Treatment: 0 Information Obtained From Patient Constitutional Symptoms (General Health) Complaints and Symptoms: Negative for: Fatigue; Fever; Chills; Marked Weight Change Eyes Complaints and Symptoms: Negative for: Dry Eyes; Vision Changes; Glasses / Contacts Ear/Nose/Mouth/Throat Complaints and Symptoms: Negative for: Difficult clearing ears; Sinusitis Hematologic/Lymphatic Complaints and Symptoms: Negative for: Bleeding / Clotting Disorders; Human Immunodeficiency Virus Respiratory Complaints and Symptoms: Negative for:  Chronic or frequent coughs; Shortness of Breath Cardiovascular Complaints and Symptoms: Negative for: Chest pain; LE edema Gastrointestinal Complaints and Symptoms: Negative for: Frequent diarrhea; Nausea; Vomiting Endocrine Complaints and Symptoms: Negative for: Hepatitis; Thyroid disease; Polydypsia (Excessive Thirst) Genitourinary Complaints and Symptoms: Negative for: Kidney failure/ Dialysis;  Incontinence/dribbling Immunological Complaints and Symptoms: Negative for: Hives; Itching Nash ShearerCOLLINS, Sou ([id number]) [id number] Page 9 of 10 Integumentary (Skin) Complaints and Symptoms: Positive for: Wounds Musculoskeletal Complaints and Symptoms: Negative for: Muscle Pain; Muscle Weakness Neurologic Complaints and Symptoms: Negative for: Numbness/parasthesias; Focal/Weakness Psychiatric Complaints and Symptoms: Negative for: Anxiety; Claustrophobia Oncologic Immunizations Pneumococcal Vaccine: Received Pneumococcal Vaccination: No Tetanus Vaccine: Last tetanus shot: 10/23/2011 Implantable Devices None Family and Social History Former smoker - ended on 10/22/2010; Marital Status - Married; Alcohol Use: Moderate; Drug Use: No History; Caffeine Use: Daily - coffee Electronic Signature(s) Signed: 11/13/2022 5:22:14 PM By: Lenda KelpStone III, Augusta Hilbert PA-C Signed: 11/14/2022 5:47:34 PM By: Elliot GurneyWoody, BSN, RN, CWS, Kim RN, BSN Entered By: Elliot GurneyWoody, BSN, RN, CWS, Kim on 11/13/2022 09:19:05 -------------------------------------------------------------------------------- SuperBill Details Patient Name: Date of Service: Farrel GobbleCO LLINS, Amaar 11/13/2022 Medical Record Number: [id number] Patient Account Number: [id number] Date of Birth/Sex: Treating RN: [birth year] (36 y.o. Male) Huel CoventryWoody, Kim Primary Care Provider: Cheryll CockayneBurns, Stacy Other Clinician: Referring Provider: Treating Provider/Extender: Jenne CampusStone, Revel Stellmach Burns, Stacy Weeks in Treatment: 0 Diagnosis Coding ICD-10 Codes Code Description [phone] Non-pressure chronic ulcer of skin of other sites with fat layer exposed L05.01 Pilonidal cyst with abscess Facility Procedures : Darletta MollCOLLINS, CPT4 Code: [facility]asim ([id number]) [id number] Description: [id number] - WOUND CARE VISIT-LEV 3 EST PT Modifier: [id number] Quantity: [id number] Page 10 of 10 1 Physician Procedures : CPT4 Code Description Modifier [id number] - WC PHYS LEVEL 4  - EST PT ICD-10 Diagnosis Description L98.492 Non-pressure chronic ulcer of skin of other sites with fat layer exposed L05.01 Pilonidal cyst with abscess Quantity: 1 Electronic Signature(s) Signed: 11/15/2022 1:57:23 PM By: Elliot GurneyWoody, BSN, RN, CWS, Kim RN, BSN Signed: 11/16/2022 2:17:33 PM By: Lenda KelpStone III, Regie Bunner PA-C Previous Signature: 11/13/2022 10:46:00 AM Version By: Lenda KelpStone III, Kairi Harshbarger PA-C Entered By: Elliot GurneyWoody, BSN, RN, CWS, Kim on 11/15/2022 13:57:23

## 2022-11-15 NOTE — Progress Notes (Signed)
[place], [name] ([id number]) [id number] Page 1 of 5 Visit Report for 11/13/2022 Abuse Risk Screen Details Patient Name: Date of Service: [name], [name] 11/13/2022 8:45 A M Medical Record Number: [id number] Patient Account Number: [id number] Date of Birth/Sex: Treating RN: May 19, 1986 (37 y.o. Male) Cornell Barman Primary Care Meridian Scherger: Billey Gosling Other Clinician: Referring Mazzy Santarelli: Treating Falcon Mccaskey/Extender: Rosemarie Beath Weeks in Treatment: 0 Abuse Risk Screen Items Answer ABUSE RISK SCREEN: Has anyone close to you tried to hurt or harm you recentlyo No Do you feel uncomfortable with anyone in your familyo No Has anyone forced you do things that you didnt want to doo No Electronic Signature(s) Signed: 11/14/2022 5:47:34 PM By: Gretta Cool, BSN, RN, CWS, Kim RN, BSN Entered By: Gretta Cool, BSN, RN, CWS, Kim on 11/13/2022 09:19:11 -------------------------------------------------------------------------------- Activities of Daily Living Details Patient Name: Date of Service: [name], [name] 11/13/2022 8:45 A M Medical Record Number: [id number] Patient Account Number: [id number] Date of Birth/Sex: Treating RN: [birth year] (37 y.o. Male) Cornell Barman Primary Care Verland Sprinkle: Billey Gosling Other Clinician: Referring Griffin Dewilde: Treating Avonna Iribe/Extender: Rosemarie Beath Weeks in Treatment: 0 Activities of Daily Living Items Answer Activities of Daily Living (Please select one for each item) Drive Automobile Completely Able T Medications ake Completely Able Use T elephone Completely Able Care for Appearance Completely Able Use T oilet Completely Able Bath / Shower Completely Able Dress Self Completely Able Feed Self Completely Able Walk Completely Able Get In / Out Bed Completely Able Housework Completely [place], [place] ([id number]) [id number] Page 2 of 5 Prepare Meals Completely Able Handle Money  Completely Able Shop for Self Completely Able Electronic Signature(s) Signed: 11/14/2022 5:47:34 PM By: Gretta Cool, BSN, RN, CWS, Kim RN, BSN Entered By: Gretta Cool, BSN, RN, CWS, Kim on 11/13/2022 09:19:23 -------------------------------------------------------------------------------- Education Screening Details Patient Name: Date of Service: [name], [name] 11/13/2022 8:45 A M Medical Record Number: [id number] Patient Account Number: [id number] Date of Birth/Sex: Treating RN: 01/24/1986 (37 y.o. Male) Cornell Barman Primary Care Myia Bergh: Billey Gosling Other Clinician: Referring Quintel Mccalla: Treating Skylan Lara/Extender: Elenore Paddy in Treatment: 0 Primary Learner Assessed: Patient Learning Preferences/Education Level/Primary Language Preferred Language: English Cognitive Barrier Language Barrier: No Translator Needed: No Memory Deficit: No Emotional Barrier: No Physical Barrier Impaired Vision: No Impaired Hearing: No Decreased Hand dexterity: No Knowledge/Comprehension Knowledge Level: High Comprehension Level: High Ability to understand written instructions: High Ability to understand verbal instructions: High Motivation Anxiety Level: Calm Cooperation: Cooperative Education Importance: Acknowledges Need Interest in Health Problems: Asks Questions Perception: Coherent Willingness to Engage in Self-Management High Activities: Readiness to Engage in Self-Management High Activities: Electronic Signature(s) Signed: 11/14/2022 5:47:34 PM By: Gretta Cool, BSN, RN, CWS, Kim RN, BSN Entered By: Gretta Cool, BSN, RN, CWS, Kim on 11/13/2022 09:19:43 Theda Sers, [name] ([id number]) [phone] [id number] Page 3 of 5 -------------------------------------------------------------------------------- Fall Risk Assessment Details Patient Name: Date of Service: [name], [name] 11/13/2022 8:45 A M Medical Record Number: [id number] Patient Account Number: [id number] Date of  Birth/Sex: Treating RN: 1986-07-25 (37 y.o. Male) Cornell Barman Primary Care Shavawn Stobaugh: Billey Gosling Other Clinician: Referring Jakaiya Netherland: Treating Raquel Racey/Extender: Rosemarie Beath Weeks in Treatment: 0 Fall Risk Assessment Items Have you had 2 or more falls in the last 12 monthso 0 No Have you had any fall that resulted in injury in the last 12 monthso 0 No FALLS RISK SCREEN History of falling - immediate or within 3 months 0 No Secondary diagnosis (Do you have 2 or more medical diagnoseso) 0 No Ambulatory aid None/bed rest/wheelchair/nurse 0  Yes Crutches/cane/walker 0 No Furniture 0 No Intravenous therapy Access/Saline/Heparin Lock 0 No Gait/Transferring Normal/ bed rest/ wheelchair 0 Yes Weak (short steps with or without shuffle, stooped but able to lift head while walking, may seek 0 No support from furniture) Impaired (short steps with shuffle, may have difficulty arising from chair, head down, impaired 0 No balance) Mental Status Oriented to own ability 0 Yes Electronic Signature(s) Signed: 11/14/2022 5:47:34 PM By: Gretta Cool, BSN, RN, CWS, Kim RN, BSN Entered By: Gretta Cool, BSN, RN, CWS, Kim on 11/13/2022 09:19:56 -------------------------------------------------------------------------------- Foot Assessment Details Patient Name: Date of Service: [name], [name] 11/13/2022 8:45 A M Medical Record Number: [id number] Patient Account Number: [id number] Date of Birth/Sex: Treating RN: 1985-11-03 (37 y.o. Male) Cornell Barman Primary Care Anniya Whiters: Billey Gosling Other Clinician: Referring Ayeshia Coppin: Treating Coady Train/Extender: Rosemarie Beath Weeks in Treatment: 0 Foot Assessment Items Site Locations [place], [place] ([id number]) [id number] Page 4 of 5 + = Sensation present, - = Sensation absent, C = Callus, U = Ulcer R = Redness, W = Warmth, M = Maceration, PU = Pre-ulcerative lesion F = Fissure, S = Swelling, D =  Dryness Assessment Right: Left: Other Deformity: No No Prior Foot Ulcer: No No Prior Amputation: No No Charcot Joint: No No Ambulatory Status: Ambulatory Without Help Gait: Steady Electronic Signature(s) Signed: 11/14/2022 5:47:34 PM By: Gretta Cool, BSN, RN, CWS, Kim RN, BSN Entered By: Gretta Cool, BSN, RN, CWS, Kim on 11/13/2022 09:20:11 -------------------------------------------------------------------------------- Nutrition Risk Screening Details Patient Name: Date of Service: [name], [name] 11/13/2022 8:45 A M Medical Record Number: [id number] Patient Account Number: [id number] Date of Birth/Sex: Treating RN: 04/26/1986 (37 y.o. Male) Cornell Barman Primary Care Christinna Sprung: Billey Gosling Other Clinician: Referring Inara Dike: Treating Sylwia Cuervo/Extender: Rosemarie Beath Weeks in Treatment: 0 Height (in): 67 Weight (lbs): 250 Body Mass Index (BMI): 39.2 Nutrition Risk Screening Items Score Screening NUTRITION RISK SCREEN: I have an illness or condition that made me change the kind and/or amount of food I eat 0 No I eat fewer than two meals per day 0 No I eat few fruits and vegetables, or milk products 0 No I have three or more drinks of beer, liquor or wine almost every day 0 No I have tooth or mouth problems that make it hard for me to eat 0 No I don't always have enough money to buy the food I need 0 No [place], [place]aspal ([id number]) [id number] Page 5 of 5 I eat alone most of the time 0 No I take three or more different prescribed or over-the-counter drugs a day 0 No Without wanting to, I have lost or gained 10 pounds in the last six months 0 No I am not always physically able to shop, cook and/or feed myself 0 No Nutrition Protocols Good Risk Protocol 0 No interventions needed Moderate Risk Protocol High Risk Proctocol Risk Level: Good Risk Score: 0 Electronic Signature(s) Signed: 11/14/2022 5:47:34 PM By: Gretta Cool, BSN, RN, CWS, Kim RN,  BSN Entered By: Gretta Cool, BSN, RN, CWS, Kim on 11/13/2022 09:20:01

## 2022-11-26 ENCOUNTER — Ambulatory Visit: Payer: Self-pay | Admitting: Surgery

## 2022-11-26 DIAGNOSIS — I10 Essential (primary) hypertension: Secondary | ICD-10-CM | POA: Diagnosis not present

## 2022-11-26 DIAGNOSIS — L988 Other specified disorders of the skin and subcutaneous tissue: Secondary | ICD-10-CM | POA: Diagnosis not present

## 2022-11-26 NOTE — H&P (Signed)
Subjective:    CC: Pilonidal disease [L98.8]   HPI:  [name] is a 37 y.o. male who presents for evaluation of above. Previous issues couple years ago, s/p excision. Wound healing issues but had about 2-3months of asymptomatic time and healed wound but started developing drainage again.  No pain.  Currently under care with wound care   Past Medical History:  has no past medical history on file.   Past Surgical History:  has a past surgical history that includes Pilonidal cyst / sinus excision (04/13/2021).   Family History: family history is not on file.   Social History:  reports that he has never smoked. He has never used smokeless tobacco. He reports that he does not drink alcohol and does not use drugs.   Current Medications: has a current medication list which includes the following prescription(s): tramadol.   Allergies:  No Known Allergies   ROS:  A 15 point review of systems was performed and pertinent positives and negatives noted in HPI   Objective:    BP (!) 171/114   Pulse 78   Ht 170.2 cm (5' 7")   Wt (!) 119.7 kg (264 lb)   BMI 41.35 kg/m    Constitutional :  No distress, cooperative, alert  Lymphatics/Throat:  Supple with no lymphadenopathy  Respiratory:  Clear to auscultation bilaterally  Cardiovascular:  Regular rate and rhythm  Gastrointestinal: Soft, non-tender, non-distended, no organomegaly.  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 Recommend PCP f/u    Plan:    1. Pilonidal disease [L98.8] Discussed surgical excision.  Alternatives include continued observation.  Benefits include possible symptom relief, pathologic  evaluation, improved cosmesis. Discussed the risk of surgery including recurrence, chronic pain, post-op infxn, poor cosmesis, poor/delayed wound healing, and possible re-operation to address said risks. The risks of general anesthetic, if used, includes MI, CVA, sudden death or even reaction to anesthetic medications also discussed.  Typical post-op recovery time with activity restrictions were also discussed.   The patient verbalized understanding and all questions were answered to the patient's satisfaction.   2. Patient has elected to proceed with surgical treatment. Procedure will be scheduled. Repeat cystectomy with bascom flap.   labs/images/medications/previous chart entries reviewed personally and relevant changes/updates noted above.     

## 2022-11-26 NOTE — H&P (View-Only) (Signed)
Subjective:    CC: Pilonidal disease [L98.8]   HPI:  [name] is a 37 y.o. male who presents for evaluation of above. Previous issues couple years ago, s/p excision. Wound healing issues but had about 2-54month of asymptomatic time and healed wound but started developing drainage again.  No pain.  Currently under care with wound care   Past Medical History:  has no past medical history on file.   Past Surgical History:  has a past surgical history that includes Pilonidal cyst / sinus excision (04/13/2021).   Family History: family history is not on file.   Social History:  reports that he has never smoked. He has never used smokeless tobacco. He reports that he does not drink alcohol and does not use drugs.   Current Medications: has a current medication list which includes the following prescription(s): tramadol.   Allergies:  No Known Allergies   ROS:  A 15 point review of systems was performed and pertinent positives and negatives noted in HPI   Objective:    BP (!) 171/114   Pulse 78   Ht 170.2 cm ('5\' 7"'$ )   Wt (!) 119.7 kg (264 lb)   BMI 41.35 kg/m    Constitutional :  No distress, cooperative, alert  Lymphatics/Throat:  Supple with no lymphadenopathy  Respiratory:  Clear to auscultation bilaterally  Cardiovascular:  Regular rate and rhythm  Gastrointestinal: Soft, non-tender, non-distended, no organomegaly.  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 Recommend PCP f/u    Plan:    1. Pilonidal disease [L98.8] Discussed surgical excision.  Alternatives include continued observation.  Benefits include possible symptom relief, pathologic  evaluation, improved cosmesis. Discussed the risk of surgery including recurrence, chronic pain, post-op infxn, poor cosmesis, poor/delayed wound healing, and possible re-operation to address said risks. The risks of general anesthetic, if used, includes MI, CVA, sudden death or even reaction to anesthetic medications also discussed.  Typical post-op recovery time with activity restrictions were also discussed.   The patient verbalized understanding and all questions were answered to the patient's satisfaction.   2. Patient has elected to proceed with surgical treatment. Procedure will be scheduled. Repeat cystectomy with bascom flap.   labs/images/medications/previous chart entries reviewed personally and relevant changes/updates noted above.

## 2022-12-11 ENCOUNTER — Encounter
Admission: RE | Admit: 2022-12-11 | Discharge: 2022-12-11 | Disposition: A | Discharge status: Home / Self Care | Payer: BC Managed Care – PPO | Source: Ambulatory Visit | Attending: Surgery | Admitting: Surgery

## 2022-12-11 NOTE — Patient Instructions (Addendum)
Your procedure is scheduled [id number] December 20, 2022. Report to the Registration Desk on the 1st floor of the [facility]. To find out your arrival time, please call [phone] between 1PM - 3PM on: Wednesday December 19, 2022. If your arrival time is 6:00 am, do not arrive before that time as the [facility] entrance doors do not open until 6:00 am.  REMEMBER: Instructions that are not followed completely may result in serious medical risk, up to and including death; or upon the discretion of your surgeon and anesthesiologist your surgery may need to be rescheduled.  Do not eat food after midnight the night before surgery.  No gum chewing or hard candies.  You may however, drink CLEAR liquids up to 2 hours before you are scheduled to arrive for your surgery. Do not drink anything within 2 hours of your scheduled arrival time.  Clear liquids include: - water  - apple juice without pulp - gatorade (not RED colors) - black coffee or tea (Do NOT add milk or creamers to the coffee or tea) Do NOT drink anything that is not on this list.   One week prior to surgery: Stop Anti-inflammatories (NSAIDS) such as Advil, Aleve, Ibuprofen, Motrin, Naproxen, Naprosyn and Aspirin based products such as Excedrin, Goody's Powder, BC Powder. Stop ANY OVER THE COUNTER supplements until after surgery. You may however, continue to take Tylenol if needed for pain up until the day of surgery.  Continue taking all prescribed medications with the exception of the following:   Follow recommendations from Cardiologist or PCP regarding stopping blood thinners.  TAKE ONLY THESE MEDICATIONS THE MORNING OF SURGERY WITH A SIP OF WATER:  None    No Alcohol for 24 hours before or after surgery.  No Smoking including e-cigarettes for 24 hours before surgery.  No chewable tobacco products for at least 6 hours before surgery.  No nicotine patches on the day of surgery.  Do not use any "recreational"  drugs for at least a week (preferably 2 weeks) before your surgery.  Please be advised that the combination of cocaine and anesthesia may have negative outcomes, up to and including death. If you test positive for cocaine, your surgery will be cancelled.  On the morning of surgery brush your teeth with toothpaste and water, you may rinse your mouth with mouthwash if you wish. Do not swallow any toothpaste or mouthwash.  Use CHG Soap or wipes as directed on instruction sheet.  Do not wear jewelry, make-up, hairpins, clips or nail polish.  Do not wear lotions, powders, or perfumes.   Do not shave body hair from the neck down 48 hours before surgery.  Contact lenses, hearing aids and dentures may not be worn into surgery.  Do not bring valuables to the hospital. [facility] is not responsible for any missing/lost belongings or valuables.   Notify your doctor if there is any change in your medical condition (cold, fever, infection).  Wear comfortable clothing (specific to your surgery type) to the hospital.  After surgery, you can help prevent lung complications by doing breathing exercises.  Take deep breaths and cough every 1-2 hours. Your doctor may order a device called an Incentive Spirometer to help you take deep breaths. When coughing or sneezing, hold a pillow firmly against your incision with both hands. This is called "splinting." Doing this helps protect your incision. It also decreases belly discomfort.  If you are being admitted to the hospital overnight, leave your suitcase in the car.  After surgery it may be brought to your room.  In case of increased patient census, it may be necessary for you, the patient, to continue your postoperative care in the Same Day Surgery department.  If you are being discharged the day of surgery, you will not be allowed to drive home. You will need a responsible individual to drive you home and stay with you for 24 hours after surgery.   If  you are taking public transportation, you will need to have a responsible individual with you.  Please call the [facility] Dept. at [phone] if you have any questions about these instructions.  Surgery Visitation Policy:  Patients undergoing a surgery or procedure may have two family members or support persons with them as long as the person is not COVID-19 positive or experiencing its symptoms.   Inpatient Visitation:    Visiting hours are 7 a.m. to 8 p.m. Up to four visitors are allowed at one time in a patient room. The visitors may rotate out with other people during the day. One designated support person (adult) may remain overnight.  Due to an increase in RSV and influenza rates and associated hospitalizations, children ages 63 and under will not be able to visit patients in [facility]. Masks continue to be strongly recommended.Your procedure is scheduled on:     Preparing for Surgery with CHLORHEXIDINE GLUCONATE (CHG) Soap  Chlorhexidine Gluconate (CHG) Soap  o An antiseptic cleaner that kills germs and bonds with the skin to continue killing germs even after washing  o Used for showering the night before surgery and morning of surgery  Before surgery, you can play an important role by reducing the number of germs on your skin.  CHG (Chlorhexidine gluconate) soap is an antiseptic cleanser which kills germs and bonds with the skin to continue killing germs even after washing.  Please do not use if you have an allergy to CHG or antibacterial soaps. If your skin becomes reddened/irritated stop using the CHG.  1. Shower the NIGHT BEFORE SURGERY and the MORNING OF SURGERY with CHG soap.  2. If you choose to wash your hair, wash your hair first as usual with your normal shampoo.  3. After shampooing, rinse your hair and body thoroughly to remove the shampoo.  4. Use CHG as you would any other liquid soap. You can apply CHG directly to the skin and  wash gently with a scrungie or a clean washcloth.  5. Apply the CHG soap to your body only from the neck down. Do not use on open wounds or open sores. Avoid contact with your eyes, ears, mouth, and genitals (private parts). Wash face and genitals (private parts) with your normal soap.  6. Wash thoroughly, paying special attention to the area where your surgery will be performed.  7. Thoroughly rinse your body with warm water.  8. Do not shower/wash with your normal soap after using and rinsing off the CHG soap.  9. Pat yourself dry with a clean towel.  10. Wear clean pajamas to bed the night before surgery.  12. Place clean sheets on your bed the night of your first shower and do not sleep with pets.  13. Shower again with the CHG soap on the day of surgery prior to arriving at the hospital.  14. Do not apply any deodorants/lotions/powders.  15. Please wear clean clothes to the hospital.

## 2022-12-14 IMAGING — DX DG LUMBAR SPINE 2-3V
3 series · 3 of 3 positions shown · non-contrast
Comparison: None.

CLINICAL DATA: Low back pain with radicular pain.

EXAM:
LUMBAR SPINE - 2-3 VIEW

[l-spine ap]
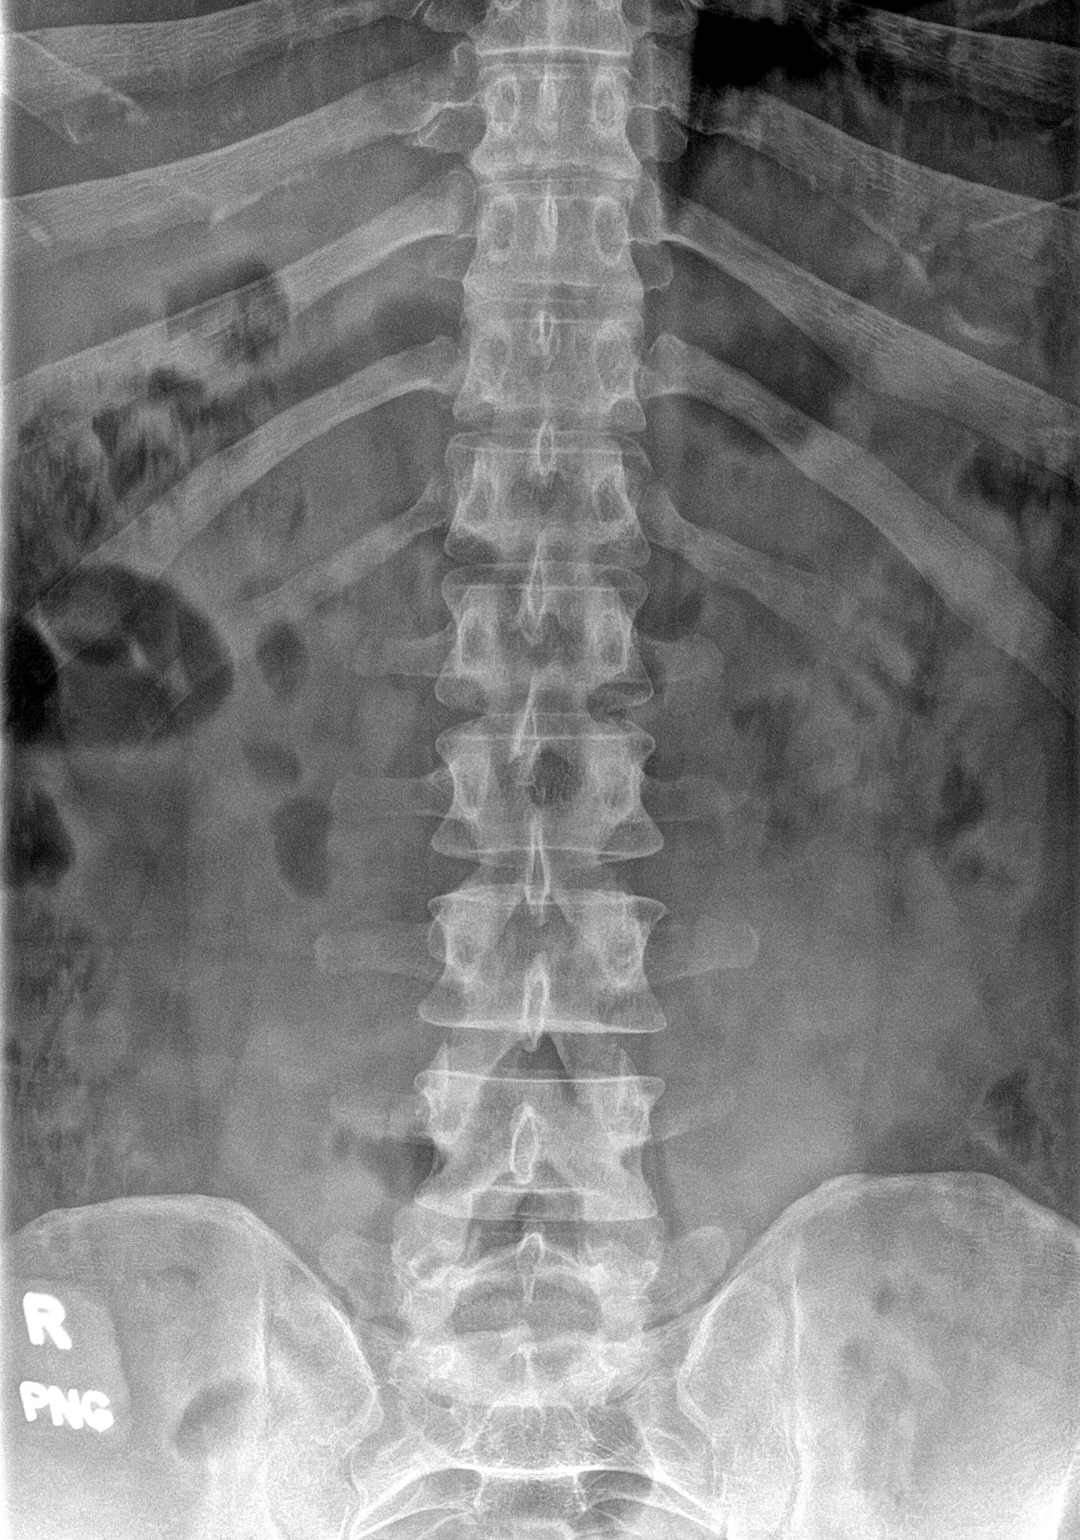

[l-spine lateral (1 of 2)]
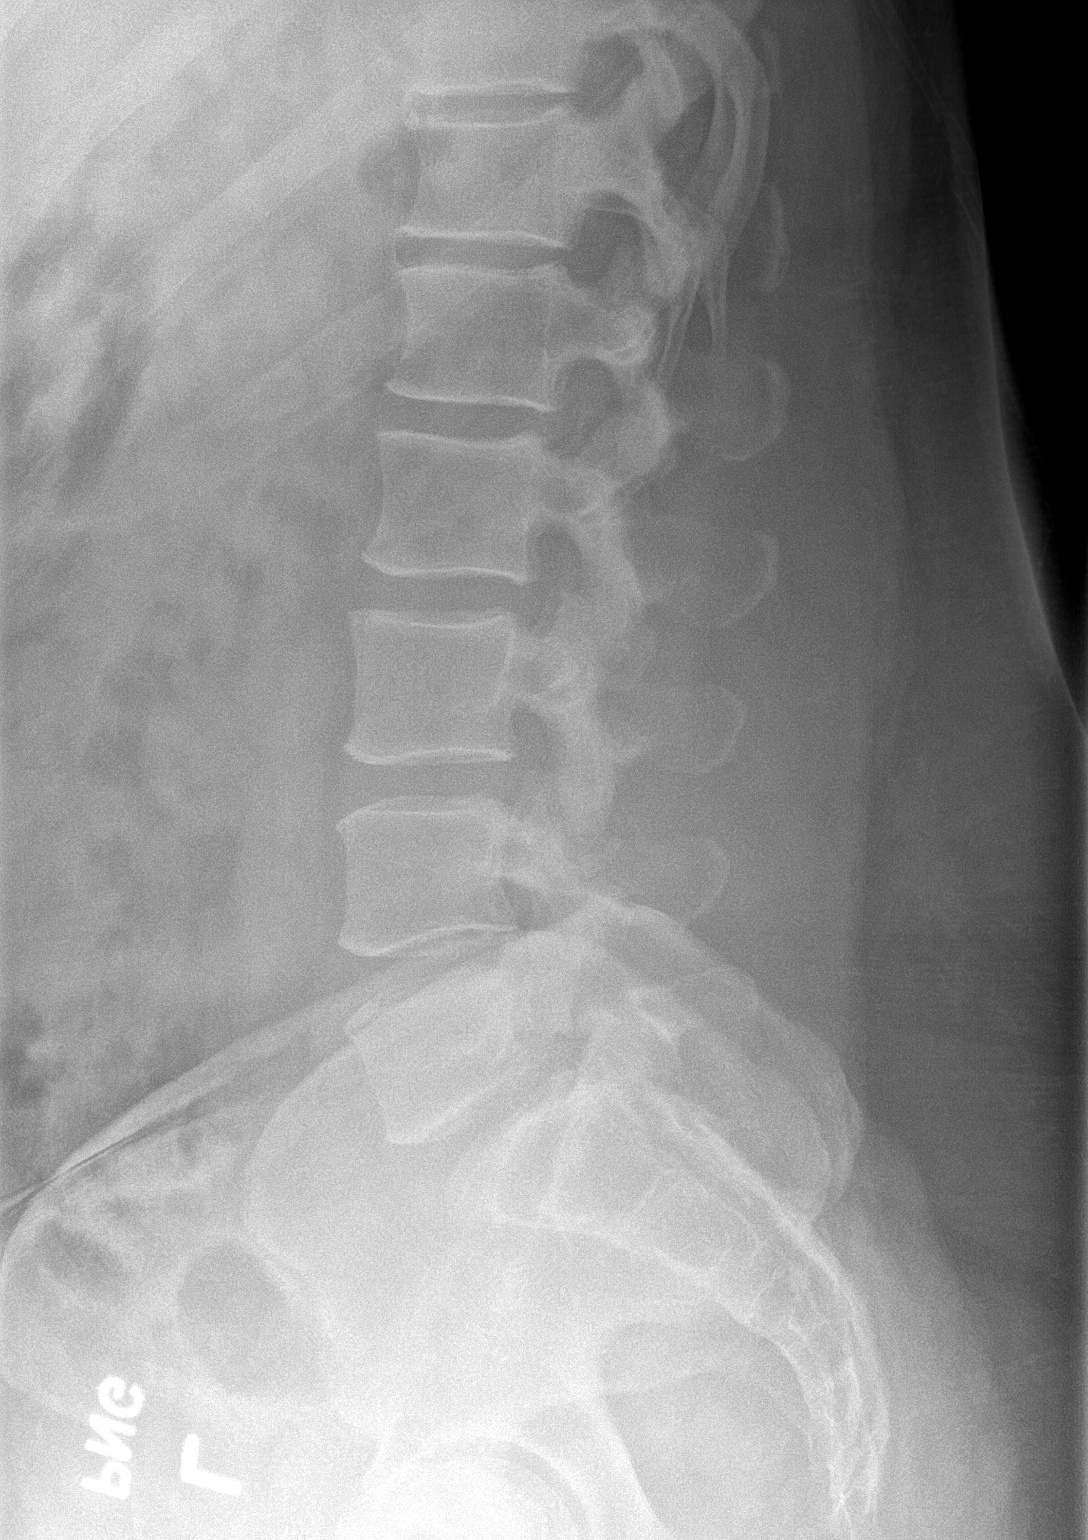

[l-spine lateral (2 of 2)]
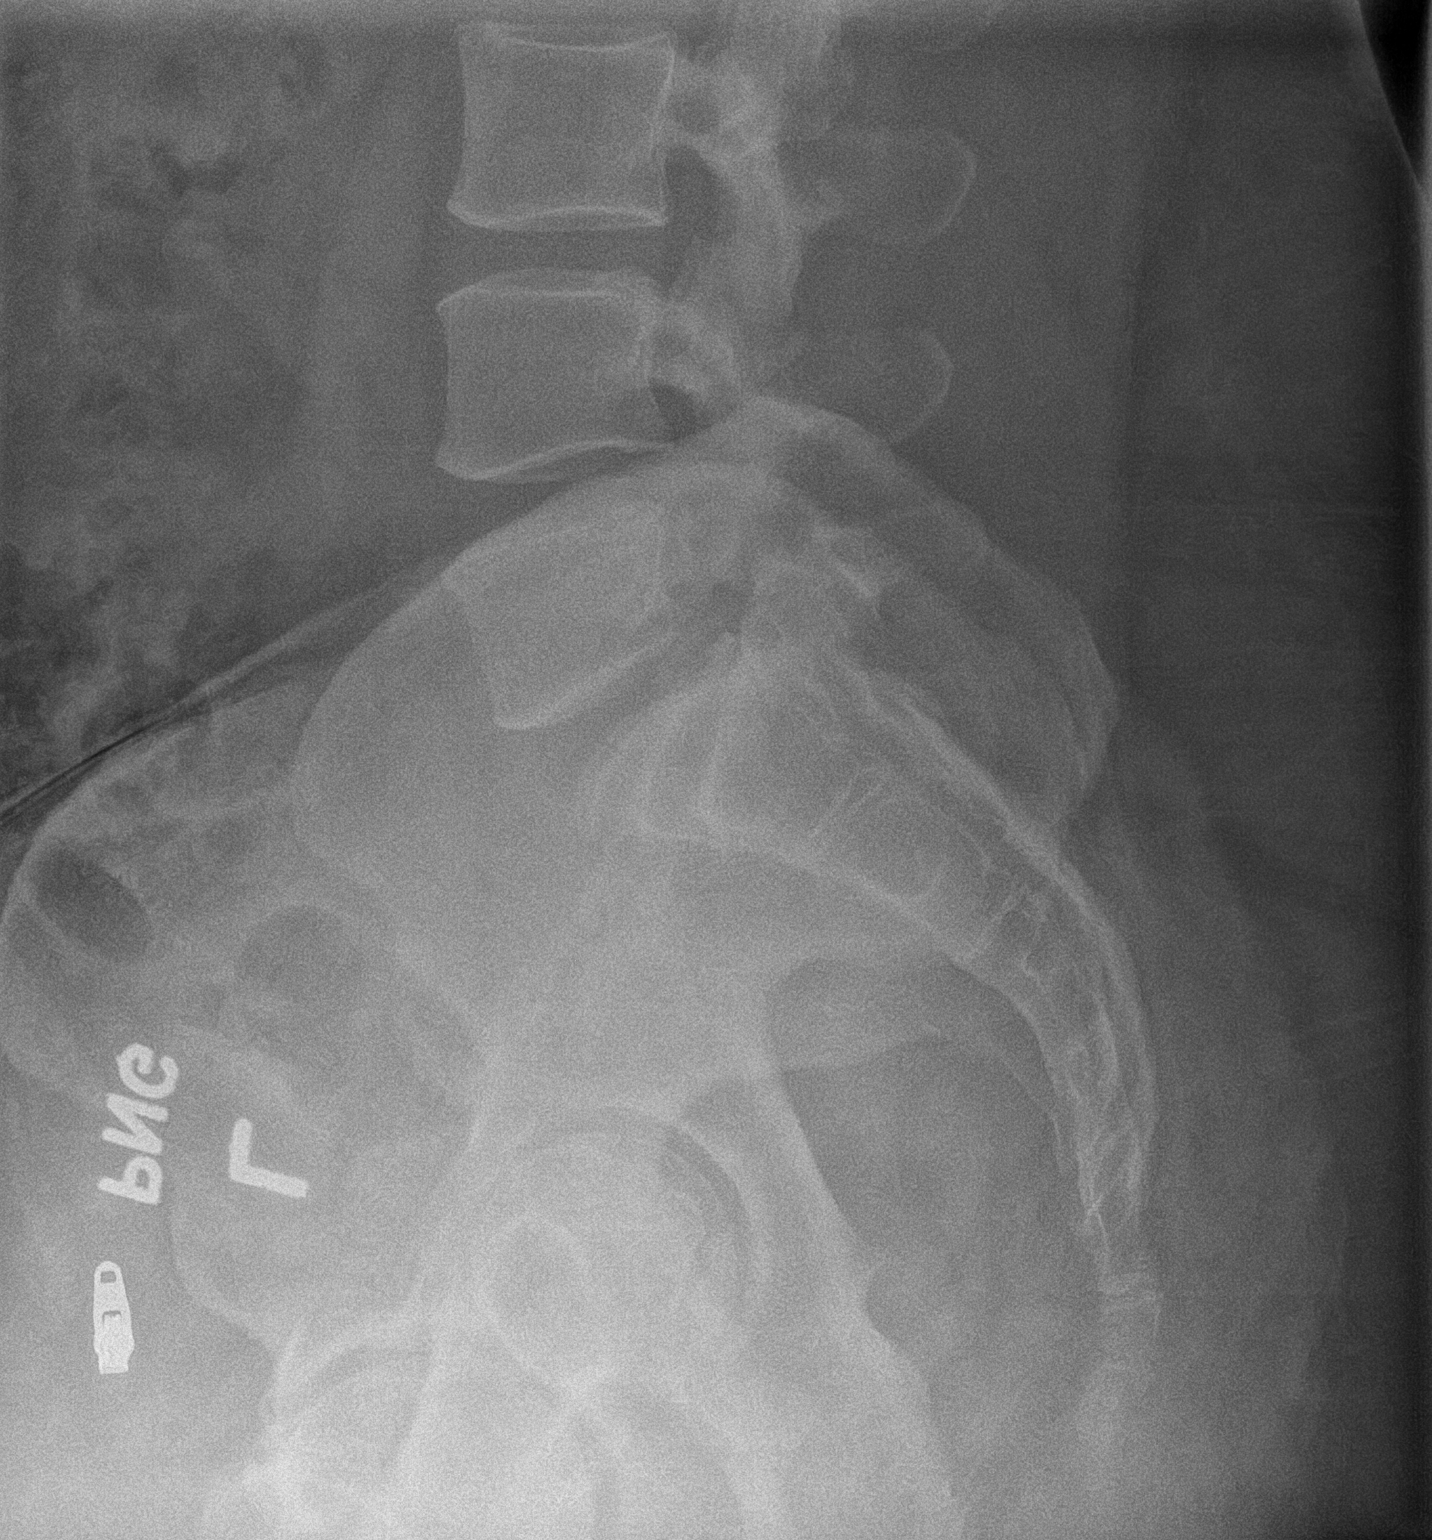

[3 of 3 positions shown; findings below may reference images not displayed]

FINDINGS: Slight retrolisthesis L4-5. Mild anterolisthesis L5-S1. Probable
bilateral pars defects of L5. Disc spaces maintained. No acute
fracture.
IMPRESSION: Probable bilateral pars defects of L5. Recommend follow-up MRI to
confirm.

## 2022-12-20 ENCOUNTER — Other Ambulatory Visit: Payer: Self-pay

## 2022-12-20 ENCOUNTER — Encounter
Admission: RE | Disposition: A | Discharge status: Home / Self Care | Payer: Self-pay | Source: Home / Self Care | Attending: Surgery

## 2022-12-20 ENCOUNTER — Ambulatory Visit: Payer: BC Managed Care – PPO | Admitting: Certified Registered"

## 2022-12-20 ENCOUNTER — Encounter: Payer: Self-pay | Admitting: Surgery

## 2022-12-20 ENCOUNTER — Ambulatory Visit
Admission: RE | Admit: 2022-12-20 | Discharge: 2022-12-20 | Disposition: A | Discharge status: Home / Self Care | Payer: BC Managed Care – PPO | Attending: Surgery | Admitting: Surgery

## 2022-12-20 DIAGNOSIS — R030 Elevated blood-pressure reading, without diagnosis of hypertension: Secondary | ICD-10-CM | POA: Diagnosis not present

## 2022-12-20 DIAGNOSIS — L0591 Pilonidal cyst without abscess: Secondary | ICD-10-CM | POA: Insufficient documentation

## 2022-12-20 DIAGNOSIS — Z87891 Personal history of nicotine dependence: Secondary | ICD-10-CM | POA: Insufficient documentation

## 2022-12-20 DIAGNOSIS — E785 Hyperlipidemia, unspecified: Secondary | ICD-10-CM | POA: Diagnosis not present

## 2022-12-20 DIAGNOSIS — Z6839 Body mass index (BMI) 39.0-39.9, adult: Secondary | ICD-10-CM | POA: Diagnosis not present

## 2022-12-20 DIAGNOSIS — E669 Obesity, unspecified: Secondary | ICD-10-CM | POA: Diagnosis not present

## 2022-12-20 DIAGNOSIS — L988 Other specified disorders of the skin and subcutaneous tissue: Secondary | ICD-10-CM | POA: Diagnosis not present

## 2022-12-20 HISTORY — PX: PILONIDAL CYST EXCISION: SHX744

## 2022-12-20 SURGERY — EXCISION, PILONIDAL CYST, EXTENSIVE
Anesthesia: General | Site: Buttocks

## 2022-12-20 MED ORDER — CHLORHEXIDINE GLUCONATE 0.12 % MT SOLN
OROMUCOSAL | Status: AC
  Administered 2022-12-20: 15 mL via OROMUCOSAL
  Filled 2022-12-20: qty 15

## 2022-12-20 MED ORDER — SUGAMMADEX SODIUM 200 MG/2ML IV SOLN
INTRAVENOUS | Status: DC | PRN
  Administered 2022-12-20: 200 mg via INTRAVENOUS

## 2022-12-20 MED ORDER — DOCUSATE SODIUM 100 MG PO CAPS: 100.0000 mg | ORAL_CAPSULE | Freq: Two times a day (BID) | ORAL | 0 refills | Status: AC | PRN

## 2022-12-20 MED ORDER — CELECOXIB 200 MG PO CAPS: 200.0000 mg | ORAL_CAPSULE | ORAL | Status: AC

## 2022-12-20 MED ORDER — ACETAMINOPHEN 500 MG PO TABS
ORAL_TABLET | ORAL | Status: AC
  Administered 2022-12-20: 1000 mg via ORAL
  Filled 2022-12-20: qty 2

## 2022-12-20 MED ORDER — LACTATED RINGERS IV SOLN: INTRAVENOUS | Status: DC | PRN

## 2022-12-20 MED ORDER — BUPIVACAINE LIPOSOME 1.3 % IJ SUSP
INTRAMUSCULAR | Status: DC | PRN
  Administered 2022-12-20: 20 mL

## 2022-12-20 MED ORDER — BUPIVACAINE-EPINEPHRINE 0.5% -1:200000 IJ SOLN
INTRAMUSCULAR | Status: DC | PRN
  Administered 2022-12-20: 20 mL

## 2022-12-20 MED ORDER — DEXAMETHASONE SODIUM PHOSPHATE 10 MG/ML IJ SOLN
INTRAMUSCULAR | Status: DC | PRN
  Administered 2022-12-20: 10 mg via INTRAVENOUS

## 2022-12-20 MED ORDER — LIDOCAINE HCL (CARDIAC) PF 100 MG/5ML IV SOSY
PREFILLED_SYRINGE | INTRAVENOUS | Status: DC | PRN
  Administered 2022-12-20: 100 mg via INTRAVENOUS

## 2022-12-20 MED ORDER — CEFAZOLIN SODIUM-DEXTROSE 2-4 GM/100ML-% IV SOLN
INTRAVENOUS | Status: AC
  Filled 2022-12-20: qty 100

## 2022-12-20 MED ORDER — ACETAMINOPHEN 500 MG PO TABS: 1000.0000 mg | ORAL_TABLET | ORAL | Status: AC

## 2022-12-20 MED ORDER — OXYCODONE HCL 5 MG PO TABS
5.0000 mg | ORAL_TABLET | Freq: Once | ORAL | Status: AC | PRN
  Administered 2022-12-20: 5 mg via ORAL

## 2022-12-20 MED ORDER — IBUPROFEN 800 MG PO TABS: 800.0000 mg | ORAL_TABLET | Freq: Three times a day (TID) | ORAL | 0 refills | Status: DC | PRN

## 2022-12-20 MED ORDER — LACTATED RINGERS IV SOLN: INTRAVENOUS | Status: DC

## 2022-12-20 MED ORDER — PROPOFOL 10 MG/ML IV BOLUS
INTRAVENOUS | Status: DC | PRN
  Administered 2022-12-20: 200 mg via INTRAVENOUS
  Administered 2022-12-20: 50 mg via INTRAVENOUS

## 2022-12-20 MED ORDER — MIDAZOLAM HCL 2 MG/2ML IJ SOLN
INTRAMUSCULAR | Status: DC | PRN
  Administered 2022-12-20: 2 mg via INTRAVENOUS

## 2022-12-20 MED ORDER — ACETAMINOPHEN 325 MG PO TABS: 650.0000 mg | ORAL_TABLET | Freq: Three times a day (TID) | ORAL | 0 refills | Status: AC | PRN

## 2022-12-20 MED ORDER — BUPIVACAINE LIPOSOME 1.3 % IJ SUSP
INTRAMUSCULAR | Status: AC
  Filled 2022-12-20: qty 20

## 2022-12-20 MED ORDER — DEXMEDETOMIDINE HCL IN NACL 80 MCG/20ML IV SOLN
INTRAVENOUS | Status: DC | PRN
  Administered 2022-12-20: 4 ug via BUCCAL
  Administered 2022-12-20: 8 ug via BUCCAL

## 2022-12-20 MED ORDER — OXYCODONE-ACETAMINOPHEN 5-325 MG PO TABS: 1.0000 | ORAL_TABLET | Freq: Three times a day (TID) | ORAL | 0 refills | Status: DC | PRN

## 2022-12-20 MED ORDER — FENTANYL CITRATE (PF) 100 MCG/2ML IJ SOLN
INTRAMUSCULAR | Status: DC | PRN
  Administered 2022-12-20 (×2): 50 ug via INTRAVENOUS

## 2022-12-20 MED ORDER — FAMOTIDINE 20 MG PO TABS: 20.0000 mg | ORAL_TABLET | Freq: Once | ORAL | Status: AC

## 2022-12-20 MED ORDER — ROCURONIUM BROMIDE 100 MG/10ML IV SOLN
INTRAVENOUS | Status: DC | PRN
  Administered 2022-12-20: 50 mg via INTRAVENOUS

## 2022-12-20 MED ORDER — OXYCODONE HCL 5 MG PO TABS
ORAL_TABLET | ORAL | Status: AC
  Filled 2022-12-20: qty 1

## 2022-12-20 MED ORDER — CHLORHEXIDINE GLUCONATE CLOTH 2 % EX PADS: 6.0000 | MEDICATED_PAD | Freq: Once | CUTANEOUS | Status: DC

## 2022-12-20 MED ORDER — ORAL CARE MOUTH RINSE: 15.0000 mL | Freq: Once | OROMUCOSAL | Status: AC

## 2022-12-20 MED ORDER — CHLORHEXIDINE GLUCONATE 0.12 % MT SOLN: 15.0000 mL | Freq: Once | OROMUCOSAL | Status: AC

## 2022-12-20 MED ORDER — GABAPENTIN 300 MG PO CAPS
ORAL_CAPSULE | ORAL | Status: AC
  Administered 2022-12-20: 300 mg via ORAL
  Filled 2022-12-20: qty 1

## 2022-12-20 MED ORDER — OXYCODONE HCL 5 MG/5ML PO SOLN: 5.0000 mg | Freq: Once | ORAL | Status: AC | PRN

## 2022-12-20 MED ORDER — BUPIVACAINE HCL (PF) 0.5 % IJ SOLN
INTRAMUSCULAR | Status: AC
  Filled 2022-12-20: qty 30

## 2022-12-20 MED ORDER — CEFAZOLIN SODIUM-DEXTROSE 2-3 GM-%(50ML) IV SOLR
INTRAVENOUS | Status: DC | PRN
  Administered 2022-12-20: 2 g via INTRAVENOUS

## 2022-12-20 MED ORDER — FAMOTIDINE 20 MG PO TABS
ORAL_TABLET | ORAL | Status: AC
  Administered 2022-12-20: 20 mg via ORAL
  Filled 2022-12-20: qty 1

## 2022-12-20 MED ORDER — CELECOXIB 200 MG PO CAPS
ORAL_CAPSULE | ORAL | Status: AC
  Administered 2022-12-20: 200 mg via ORAL
  Filled 2022-12-20: qty 1

## 2022-12-20 MED ORDER — PHENYLEPHRINE HCL (PRESSORS) 10 MG/ML IV SOLN
INTRAVENOUS | Status: DC | PRN
  Administered 2022-12-20 (×2): 160 ug via INTRAVENOUS

## 2022-12-20 MED ORDER — GABAPENTIN 300 MG PO CAPS: 300.0000 mg | ORAL_CAPSULE | ORAL | Status: AC

## 2022-12-20 MED ORDER — EPINEPHRINE PF 1 MG/ML IJ SOLN
INTRAMUSCULAR | Status: AC
  Filled 2022-12-20: qty 1

## 2022-12-20 MED ORDER — MIDAZOLAM HCL 2 MG/2ML IJ SOLN
INTRAMUSCULAR | Status: AC
  Filled 2022-12-20: qty 2

## 2022-12-20 MED ORDER — 0.9 % SODIUM CHLORIDE (POUR BTL) OPTIME
TOPICAL | Status: DC | PRN
  Administered 2022-12-20: 1000 mL

## 2022-12-20 MED ORDER — FENTANYL CITRATE (PF) 100 MCG/2ML IJ SOLN: 25.0000 ug | INTRAMUSCULAR | Status: DC | PRN

## 2022-12-20 MED ORDER — FENTANYL CITRATE (PF) 100 MCG/2ML IJ SOLN
INTRAMUSCULAR | Status: AC
  Filled 2022-12-20: qty 2

## 2022-12-20 MED ORDER — CEFAZOLIN SODIUM-DEXTROSE 2-4 GM/100ML-% IV SOLN: 2.0000 g | INTRAVENOUS | Status: DC

## 2022-12-20 MED ORDER — ONDANSETRON HCL 4 MG/2ML IJ SOLN
INTRAMUSCULAR | Status: DC | PRN
  Administered 2022-12-20: 4 mg via INTRAVENOUS

## 2022-12-20 SURGICAL SUPPLY — 43 items
ADH LQ OCL WTPRF AMP STRL LF (MISCELLANEOUS)
ADH SKN CLS APL DERMABOND .7 (GAUZE/BANDAGES/DRESSINGS) ×1
ADHESIVE MASTISOL STRL (MISCELLANEOUS) ×1 IMPLANT
BLADE CLIPPER SURG (BLADE) ×1 IMPLANT
BLADE SURG SZ10 CARB STEEL (BLADE) ×1 IMPLANT
BRIEF MESH DISP 2XL (UNDERPADS AND DIAPERS) ×1 IMPLANT
BRUSH SCRUB EZ  4% CHG (MISCELLANEOUS)
BRUSH SCRUB EZ 4% CHG (MISCELLANEOUS) ×1 IMPLANT
BULB RESERV EVAC DRAIN JP 100C (MISCELLANEOUS) IMPLANT
DERMABOND ADVANCED .7 DNX12 (GAUZE/BANDAGES/DRESSINGS) IMPLANT
DRAIN CHANNEL JP 15F RND 16 (MISCELLANEOUS) IMPLANT
DRAPE LAPAROTOMY 100X77 ABD (DRAPES) ×1 IMPLANT
ELECT CAUTERY BLADE 6.4 (BLADE) IMPLANT
ELECT REM PT RETURN 9FT ADLT (ELECTROSURGICAL) ×1
ELECTRODE REM PT RTRN 9FT ADLT (ELECTROSURGICAL) ×1 IMPLANT
GAUZE 4X4 16PLY ~~LOC~~+RFID DBL (SPONGE) ×1 IMPLANT
GAUZE SPONGE 4X4 12PLY STRL (GAUZE/BANDAGES/DRESSINGS) ×1 IMPLANT
GLOVE BIOGEL PI IND STRL 7.0 (GLOVE) ×1 IMPLANT
GLOVE SURG SYN 6.5 ES PF (GLOVE) ×1 IMPLANT
GLOVE SURG SYN 6.5 PF PI (GLOVE) ×1 IMPLANT
GOWN STRL REUS W/ TWL LRG LVL3 (GOWN DISPOSABLE) ×2 IMPLANT
GOWN STRL REUS W/TWL LRG LVL3 (GOWN DISPOSABLE) ×2
KIT TURNOVER KIT A (KITS) ×1 IMPLANT
MANIFOLD NEPTUNE II (INSTRUMENTS) ×1 IMPLANT
NEEDLE HYPO 22GX1.5 SAFETY (NEEDLE) ×1 IMPLANT
PACK BASIN MINOR ARMC (MISCELLANEOUS) ×1 IMPLANT
PAD ABD DERMACEA PRESS 5X9 (GAUZE/BANDAGES/DRESSINGS) ×1 IMPLANT
SOL PREP PVP 2OZ (MISCELLANEOUS) ×1
SOLUTION PREP PVP 2OZ (MISCELLANEOUS) ×1 IMPLANT
SPONGE DRAIN TRACH 4X4 STRL 2S (GAUZE/BANDAGES/DRESSINGS) IMPLANT
SUT ETHILON 3-0 FS-10 30 BLK (SUTURE) ×1
SUT MNCRL 4-0 (SUTURE) ×1
SUT MNCRL 4-0 27XMFL (SUTURE) ×1
SUT VIC AB 3-0 SH 27 (SUTURE) ×2
SUT VIC AB 3-0 SH 27X BRD (SUTURE) ×1 IMPLANT
SUTURE EHLN 3-0 FS-10 30 BLK (SUTURE) IMPLANT
SUTURE MNCRL 4-0 27XMF (SUTURE) ×1 IMPLANT
SYR 10ML LL (SYRINGE) ×1 IMPLANT
SYR 20ML LL LF (SYRINGE) ×1 IMPLANT
SYR BULB IRRIG 60ML STRL (SYRINGE) ×1 IMPLANT
TAPE CLOTH 3X10 WHT NS LF (GAUZE/BANDAGES/DRESSINGS) ×1 IMPLANT
TRAP FLUID SMOKE EVACUATOR (MISCELLANEOUS) ×1 IMPLANT
WATER STERILE IRR 500ML POUR (IV SOLUTION) ×1 IMPLANT

## 2022-12-20 NOTE — Anesthesia Procedure Notes (Signed)
Procedure Name: Intubation Date/Time: 12/20/2022 12:29 PM  Performed by: Otho Perl, CRNAPre-anesthesia Checklist: Patient identified, Patient being monitored, Timeout performed, Emergency Drugs available and Suction available Patient Re-evaluated:Patient Re-evaluated prior to induction Oxygen Delivery Method: Circle system utilized Preoxygenation: Pre-oxygenation with 100% oxygen Induction Type: IV induction Ventilation: Mask ventilation without difficulty Laryngoscope Size: Mac and 3 Grade View: Grade I Tube type: Oral Tube size: 7.5 mm Number of attempts: 1 Airway Equipment and Method: Stylet Placement Confirmation: ETT inserted through vocal cords under direct vision, positive ETCO2 and breath sounds checked- equal and bilateral Secured at: 22 cm Tube secured with: Tape Dental Injury: Teeth and Oropharynx as per pre-operative assessment

## 2022-12-20 NOTE — Anesthesia Preprocedure Evaluation (Signed)
Anesthesia Evaluation  Patient identified by MRN, date of birth, ID band Patient awake    Reviewed: Allergy & Precautions, NPO status , Patient's Chart, lab work & pertinent test results  Airway Mallampati: III  TM Distance: >3 FB Neck ROM: full    Dental  (+) Dental Advidsory Given, Teeth Intact   Pulmonary neg pulmonary ROS, former smoker   Pulmonary exam normal        Cardiovascular negative cardio ROS Normal cardiovascular exam     Neuro/Psych negative neurological ROS  negative psych ROS   GI/Hepatic negative GI ROS, Neg liver ROS,,,  Endo/Other  negative endocrine ROS    Renal/GU      Musculoskeletal   Abdominal   Peds  Hematology negative hematology ROS (+)   Anesthesia Other Findings Past Medical History: 09/2020: COVID     Comment:  sob cough fatigue loss of taste/wmell x 3 weeks all               symptoms resolved 04/10/2021: Elevated blood pressure reading 07/2020: Pilonidal cyst     Comment:  drains occ with bleeding uses soap and water  Past Surgical History: No date: NO PAST SURGERIES 04/13/2021: PILONIDAL CYST EXCISION; N/A     Comment:  Procedure: PILONIDAL CYST EXCISION;  Surgeon: Dwan Bolt, MD;  Location: [facility];                Service: General;  Laterality: N/A;  BMI    Body Mass Index: 39.16 kg/m      Reproductive/Obstetrics negative OB ROS                             Anesthesia Physical Anesthesia Plan  ASA: 2  Anesthesia Plan: General ETT   Post-op Pain Management:    Induction: Intravenous  PONV Risk Score and Plan: Ondansetron, Dexamethasone, Midazolam and Treatment may vary due to age or medical condition  Airway Management Planned: Oral ETT  Additional Equipment:   Intra-op Plan:   Post-operative Plan: Extubation in OR  Informed Consent: I have reviewed the patients History and Physical, chart,  labs and discussed the procedure including the risks, benefits and alternatives for the proposed anesthesia with the patient or authorized representative who has indicated his/her understanding and acceptance.     Dental Advisory Given  Plan Discussed with: Anesthesiologist, CRNA and Surgeon  Anesthesia Plan Comments: (Patient consented for risks of anesthesia including but not limited to:  - adverse reactions to medications - damage to eyes, teeth, lips or other oral mucosa - nerve damage due to positioning  - sore throat or hoarseness - Damage to heart, brain, nerves, lungs, other parts of body or loss of life  Patient voiced understanding.)       Anesthesia Quick Evaluation

## 2022-12-20 NOTE — Discharge Instructions (Addendum)
Removal, Care After This sheet gives you information about how to care for yourself after your procedure. Your health care provider may also give you more specific instructions. If you have problems or questions, contact your health care provider. What can I expect after the procedure? After the procedure, it is common to have: Soreness. Bruising. Itching. Follow these instructions at home: site care Follow instructions from your health care provider about how to take care of your site. Make sure you: DO NO LAY ON YOUR BACK AS MUCH AS POSSIBLE Wash your hands with soap and water before and after you change your bandage (dressing). If soap and water are not available, use hand sanitizer. Leave stitches (sutures), skin glue, or adhesive strips in place. These skin closures may need to stay in place for 2 weeks or longer. If adhesive strip edges start to loosen and curl up, you may trim the loose edges. Do not remove adhesive strips completely unless your health care provider tells you to do that. If the area bleeds or bruises, apply gentle pressure for 10 minutes. OK TO SHOWER IN 48HRS AFTER REMOVING DRESSING AROUND DRAIN.  PAT DRY AND THEN REAPPLY DRESSING AROUND DRAIN DAILY  Check your site every day for signs of infection. Check for: Redness, swelling, or pain. Fluid or blood. Warmth. Pus or a bad smell.  General instructions Rest and then return to your normal activities as told by your health care provider.  tylenol and advil as needed for discomfort.  Please alternate between the two every four hours as needed for pain.    Use narcotics, if prescribed, only when tylenol and motrin is not enough to control pain.  325-'650mg'$  every 8hrs to max of '3000mg'$ /24hrs (including the '325mg'$  in every norco dose) for the tylenol.    Advil up to '800mg'$  per dose every 8hrs as needed for pain.   Keep all follow-up visits as told by your health care provider. This is important. Contact a health care  provider if: You have redness, swelling, or pain around your site. You have fluid or blood coming from your site. Your site feels warm to the touch. You have pus or a bad smell coming from your site. You have a fever. Your sutures, skin glue, or adhesive strips loosen or come off sooner than expected. Get help right away if: You have bleeding that does not stop with pressure or a dressing. Summary After the procedure, it is common to have some soreness, bruising, and itching at the site. Follow instructions from your health care provider about how to take care of your site. Check your site every day for signs of infection. Contact a health care provider if you have redness, swelling, or pain around your site, or your site feels warm to the touch. Keep all follow-up visits as told by your health care provider. This is important. This information is not intended to replace advice given to you by your health care provider. Make sure you discuss any questions you have with your health care provider. Document Released: 11/04/2015 Document Revised: 04/07/2018 Document Reviewed: 04/07/2018 Elsevier Interactive Patient Education  2019 [place]   The drugs that you were given will stay in your system until tomorrow so for the next 24 hours you should not:  Drive an automobile Make any legal decisions Drink any alcoholic beverage   You may resume regular meals tomorrow.  Today it is better to start with liquids and gradually  work up to [employer].  You may eat anything you prefer, but it is better to start with liquids, then soup and crackers, and gradually work up to solid foods.   Please notify your doctor immediately if you have any unusual bleeding, trouble breathing, redness and pain at the surgery site, drainage, fever, or pain not relieved by medication.     Information for Discharge Teaching:  DO NOT [place] for 96 hours, 4 days; 0304/2024 EXPAREL (bupivacaine liposome injectable suspension)   Your surgeon or anesthesiologist gave you EXPAREL(bupivacaine) to help control your pain after surgery.  EXPAREL is a local anesthetic that provides pain relief by numbing the tissue around the surgical site. EXPAREL is designed to release pain medication over time and can control pain for up to 72 hours. Depending on how you respond to EXPAREL, you may require less pain medication during your recovery.  Possible side effects: Temporary loss of sensation or ability to move in the area where bupivacaine was injected. Nausea, vomiting, constipation Rarely, numbness and tingling in your mouth or lips, lightheadedness, or anxiety may occur. Call your doctor right away if you think you may be experiencing any of these sensations, or if you have other questions regarding possible side effects.  Follow all other discharge instructions given to you by your surgeon or nurse. Eat a healthy diet and drink plenty of water or other fluids.  If you return to the hospital for any reason within 96 hours following the administration of EXPAREL, it is important for health care providers to know that you have received this anesthetic. A teal colored band has been placed on your arm with the date, time and amount of EXPAREL you have received in order to alert and inform your health care providers. Please leave this armband in place for the full 96 hours following administration, and then you may remove the band.          Please contact your physician with any problems or Same Day Surgery at [phone], Monday through Friday 6 am to 4 pm, or [place] [facility] number at [phone].

## 2022-12-20 NOTE — Anesthesia Postprocedure Evaluation (Signed)
Anesthesia Post Note  Patient: [name]  Procedure(s) Performed: CYST EXCISION PILONIDAL EXTENSIVE (Buttocks)  Patient location during evaluation: PACU Anesthesia Type: General Level of consciousness: awake and alert, oriented and patient cooperative Pain management: pain level controlled Vital Signs Assessment: post-procedure vital signs reviewed and stable Respiratory status: spontaneous breathing, nonlabored ventilation and respiratory function stable Cardiovascular status: blood pressure returned to baseline and stable Postop Assessment: adequate PO intake Anesthetic complications: no   No notable events documented.   Last Vitals:  Vitals:   12/20/22 1415 12/20/22 1427  BP: 117/71 117/70  Pulse: 75 64  Resp: 16 19  Temp: (!) 36.3 C   SpO2: 97% 99%    Last Pain:  Vitals:   12/20/22 1408  TempSrc:   PainSc: [place]

## 2022-12-20 NOTE — Interval H&P Note (Signed)
No change. OK to proceed.

## 2022-12-20 NOTE — Interval H&P Note (Signed)
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  The various methods of treatment have been discussed with the patient and family. After consideration of risks, benefits and other options for treatment, the patient has consented to  Procedure(s): CYST EXCISION PILONIDAL EXTENSIVE (N/A) as a surgical intervention.  The patient's history has been reviewed, patient examined, no change in status, stable for surgery.  I have reviewed the patient's chart and labs.  Questions were answered to the patient's satisfaction.     Deborah Lazcano Lysle Pearl

## 2022-12-20 NOTE — Transfer of Care (Signed)
Immediate Anesthesia Transfer of Care Note  Patient: [name]  Procedure(s) Performed: CYST EXCISION PILONIDAL EXTENSIVE (Buttocks)  Patient Location: PACU  Anesthesia Type:General  Level of Consciousness: drowsy  Airway & Oxygen Therapy: Patient Spontanous Breathing and Patient connected to face mask oxygen  Post-op Assessment: Report given to RN and Post -op Vital signs reviewed and stable  Post vital signs: Reviewed and stable  Last Vitals:  Vitals Value Taken Time  BP 94/53 12/20/22 1350  Temp    Pulse 67 12/20/22 1353  Resp 5 12/20/22 1353  SpO2 99 % 12/20/22 1353  Vitals shown include unvalidated device data.  Last Pain:  Vitals:   12/20/22 1101  TempSrc: Oral      Patients Stated Pain Goal: 0 ([id number])  Complications: No notable events documented.

## 2022-12-20 NOTE — Op Note (Signed)
Pre-Op Dx: pilonidal disease Post-Op Dx: same Anesthesia: GETA EBL: [id number] Complications:  none apparent Specimen: pilonidal disease Procedure: pilonidal cystectomy and bascom flap closure  Surgeon: Lysle Pearl  Indications for procedure: See H&P  Description of Procedure:  Consent obtained, time out performed.  Patient placed in prone position.  Area sterilized and draped in usual position.  Gluteal fold approximation marked and then separted with tape.  Time out performed. Local infused to area previously marked.  elliptical incision made through dermis with 15blade, incorporating the diseased tissue adjacent to the medial edge of ellipse and pilonidal disease noted in subcutaneous layer.  Skin flap was then raised on the contralateral aspect of the more diseased portion (right side in this patient) to the extent of previously marked fold line.  Tape released and flap noted to extend over midline to contralateral fold mark under minimal tension. The diseased portion was then excised completely down to healthy sacral fascia and surrounding fat, passed off field pending pathology.    Wound irrigated and hemostasis noted, then flap advanced over open wound and closed in multi  layer fashion with 3-0 vicryl in interrupted fashion for deep subq layers.  15Fr drain placed right under dermal layer and flap and secured to skin using 3-0 nylon. Additional 3-0 vicryl used to approximate the dermal layer, then running 4-0 monocryl in subcuticular fashion for epidermal layer.  Wound then dressed with dermabond, drain sponge around drain site.  Pt tolerated procedure well, and transferred to PACU in stable condition. Sponge and instrument count correct at end of procedure.

## 2022-12-21 LAB — SURGICAL PATHOLOGY

## 2023-01-21 ENCOUNTER — Encounter: Payer: Self-pay | Admitting: Internal Medicine

## 2023-01-21 NOTE — Progress Notes (Signed)
Subjective:    Patient ID: [name], male    DOB: November 22, 1985, 37 y.o.   MRN: [id number]     HPI [name] is here for a physical exam and his chronic medical problems.   Lump in neck - anterior aspect - noticed it a few days ago.   He is not sure how long it has been there.  Non tender.  Mild allergies.  No recent colds.  No other obvious lumps or bumps that he has noticed.    Stools vary from solid to soft.  He is not sure if it is related to things that he is eating at times.  He denies abdominal pain.  He typically has a bowel movement every day to every other day.  He denies any blood in the stool, but does see blood when he wipes.  This started with the first pilonidal cyst surgery.  Is difficult for him to tell where the blood is coming from.  He was concerned about whether or not he needs to see GI or have a colonoscopy.   Medications and allergies reviewed with patient and updated if appropriate.  No current outpatient medications on file prior to visit.   No current facility-administered medications on file prior to visit.    Review of Systems  Constitutional:  Negative for fever.  Eyes:  Negative for visual disturbance.  Respiratory:  Positive for cough (allergies). Negative for shortness of breath and wheezing.   Cardiovascular:  Negative for chest pain, palpitations and leg swelling.  Gastrointestinal:  Negative for abdominal pain, blood in stool, constipation, diarrhea and nausea.       No gerd  Genitourinary:  Negative for difficulty urinating and dysuria.  Musculoskeletal:  Negative for arthralgias and back pain.  Skin:  Negative for rash.  Neurological:  Negative for light-headedness and headaches.  Psychiatric/Behavioral:  Negative for dysphoric mood and sleep disturbance (6-7 hrs). The patient is not nervous/anxious.        Objective:   Vitals:   01/22/23 1124  Pulse: 70  Temp: 98.5 F (36.9 C)  SpO2: 98%   Filed Weights   01/22/23 1124   Weight: 271 lb (122.9 kg)   Body mass index is 42.44 kg/m.  BP Readings from Last 3 Encounters:  12/20/22 120/72  06/02/21 130/82  04/13/21 131/77    Wt Readings from Last 3 Encounters:  01/22/23 271 lb (122.9 kg)  12/20/22 250 lb (113.4 kg)  12/11/22 250 lb (113.4 kg)      Physical Exam Constitutional: He appears well-developed and well-nourished. No distress.  HENT:  Head: Normocephalic and atraumatic.  Right Ear: External ear normal.  Left Ear: External ear normal.  Normal ear canals and TM b/l  Mouth/Throat: Oropharynx is clear and moist. Eyes: Conjunctivae and EOM are normal.  Neck: Neck supple. No tracheal deviation present. No thyromegaly present.  No carotid bruit  Cardiovascular: Normal rate, regular rhythm, normal heart sounds and intact distal pulses.   No murmur heard.  No lower extremity edema. Pulmonary/Chest: Effort normal and breath sounds normal. No respiratory distress. He has no wheezes. He has no rales.  Abdominal: Soft. He exhibits no distension. There is no tenderness. Rectal: Deferred Genitourinary: deferred  Lymphadenopathy:   He has 1 palpable lump-possible lymph node in the central anterior neck near cricoid area-nontender-overlying a small cut from shaving which may be the cause.  No other palpable cervical, auricular or occipital lymphadenopathy Skin: Skin is warm and dry. He is not diaphoretic.  Psychiatric: He has a normal mood and affect. His behavior is normal.         Assessment & Plan:   Physical exam: Screening blood work  ordered Exercise   - not able to exercise now - recovering from surgery.  Weight obese-would benefit from weight loss Substance abuse   none   Reviewed recommended immunizations.  tdap   Health Maintenance  Topic Date Due   DTaP/Tdap/Td (1 - Tdap) Never done   COVID-19 Vaccine (4 - 2023-24 season) 02/07/2023 (Originally 06/22/2022)   HIV Screening  01/08/2049 (Originally 11/17/2001)   INFLUENZA VACCINE   05/23/2023   Hepatitis C Screening  Completed   HPV VACCINES  Aged Out     See Problem List for Assessment and Plan of chronic medical problems.

## 2023-01-21 NOTE — Patient Instructions (Addendum)
Tetanus vaccine today.      Blood work was ordered.   The lab is on the first floor.    Medications changes include :   none    An ultrasound of your neck was ordered.     Someone will call you to schedule an appointment.    Return in about 1 year (around 01/22/2024) for Physical Exam.   Health Maintenance, Male Adopting a healthy lifestyle and getting preventive care are important in promoting health and wellness. Ask your health care provider about: The right schedule for you to have regular tests and exams. Things you can do on your own to prevent diseases and keep yourself healthy. What should I know about diet, weight, and exercise? Eat a healthy diet  Eat a diet that includes plenty of vegetables, fruits, low-fat dairy products, and lean protein. Do not eat a lot of foods that are high in solid fats, added sugars, or sodium. Maintain a healthy weight Body mass index (BMI) is a measurement that can be used to identify possible weight problems. It estimates body fat based on height and weight. Your health care provider can help determine your BMI and help you achieve or maintain a healthy weight. Get regular exercise Get regular exercise. This is one of the most important things you can do for your health. Most adults should: Exercise for at least 150 minutes each week. The exercise should increase your heart rate and make you sweat (moderate-intensity exercise). Do strengthening exercises at least twice a week. This is in addition to the moderate-intensity exercise. Spend less time sitting. Even light physical activity can be beneficial. Watch cholesterol and blood lipids Have your blood tested for lipids and cholesterol at 36 years of age, then have this test every 5 years. You may need to have your cholesterol levels checked more often if: Your lipid or cholesterol levels are high. You are older than 36 years of age. You are at high risk for heart disease. What  should I know about cancer screening? Many types of cancers can be detected early and may often be prevented. Depending on your health history and family history, you may need to have cancer screening at various ages. This may include screening for: Colorectal cancer. Prostate cancer. Skin cancer. Lung cancer. What should I know about heart disease, diabetes, and high blood pressure? Blood pressure and heart disease High blood pressure causes heart disease and increases the risk of stroke. This is more likely to develop in people who have high blood pressure readings or are overweight. Talk with your health care provider about your target blood pressure readings. Have your blood pressure checked: Every 3-5 years if you are 50-72 years of age. Every year if you are 56 years old or older. If you are between the ages of 94 and 61 and are a current or former smoker, ask your health care provider if you should have a one-time screening for abdominal aortic aneurysm (AAA). Diabetes Have regular diabetes screenings. This checks your fasting blood sugar level. Have the screening done: Once every three years after age 47 if you are at a normal weight and have a low risk for diabetes. More often and at a younger age if you are overweight or have a high risk for diabetes. What should I know about preventing infection? Hepatitis B If you have a higher risk for hepatitis B, you should be screened for this virus. Talk with your health care provider to find  out if you are at risk for hepatitis B infection. Hepatitis C Blood testing is recommended for: Everyone born from [birth year] through 1964. Anyone with known risk factors for hepatitis C. Sexually transmitted infections (STIs) You should be screened each year for STIs, including gonorrhea and chlamydia, if: You are sexually active and are younger than 36 years of age. You are older than 36 years of age and your health care provider tells you that you are  at risk for this type of infection. Your sexual activity has changed since you were last screened, and you are at increased risk for chlamydia or gonorrhea. Ask your health care provider if you are at risk. Ask your health care provider about whether you are at high risk for HIV. Your health care provider may recommend a prescription medicine to help prevent HIV infection. If you choose to take medicine to prevent HIV, you should first get tested for HIV. You should then be tested every 3 months for as long as you are taking the medicine. Follow these instructions at home: Alcohol use Do not drink alcohol if your health care provider tells you not to drink. If you drink alcohol: Limit how much you have to 0-2 drinks a day. Know how much alcohol is in your drink. In the [place] one drink equals one 12 oz bottle of beer (355 mL), one 5 oz glass of wine (148 mL), or one 1 oz glass of hard liquor (44 mL). Lifestyle Do not use any products that contain nicotine or tobacco. These products include cigarettes, chewing tobacco, and vaping devices, such as e-cigarettes. If you need help quitting, ask your health care provider. Do not use street drugs. Do not share needles. Ask your health care provider for help if you need support or information about quitting drugs. General instructions Schedule regular health, dental, and eye exams. Stay current with your vaccines. Tell your health care provider if: You often feel depressed. You have ever been abused or do not feel safe at home. Summary Adopting a healthy lifestyle and getting preventive care are important in promoting health and wellness. Follow your health care provider's instructions about healthy diet, exercising, and getting tested or screened for diseases. Follow your health care provider's instructions on monitoring your cholesterol and blood pressure. This information is not intended to replace advice given to you by your health care provider.  Make sure you discuss any questions you have with your health care provider. Document Revised: 02/27/2021 Document Reviewed: 02/27/2021 Elsevier Patient Education  [place].

## 2023-01-22 ENCOUNTER — Ambulatory Visit (INDEPENDENT_AMBULATORY_CARE_PROVIDER_SITE_OTHER): Payer: BC Managed Care – PPO | Admitting: Internal Medicine

## 2023-01-22 VITALS — BP 128/78 | HR 70 | Temp 98.5°F | Ht 67.0 in | Wt 271.0 lb

## 2023-01-22 DIAGNOSIS — Z0000 Encounter for general adult medical examination without abnormal findings: Secondary | ICD-10-CM | POA: Diagnosis not present

## 2023-01-22 DIAGNOSIS — Z6841 Body Mass Index (BMI) 40.0 and over, adult: Secondary | ICD-10-CM

## 2023-01-22 DIAGNOSIS — E782 Mixed hyperlipidemia: Secondary | ICD-10-CM

## 2023-01-22 DIAGNOSIS — L0591 Pilonidal cyst without abscess: Secondary | ICD-10-CM

## 2023-01-22 DIAGNOSIS — Z23 Encounter for immunization: Secondary | ICD-10-CM

## 2023-01-22 DIAGNOSIS — R739 Hyperglycemia, unspecified: Secondary | ICD-10-CM | POA: Diagnosis not present

## 2023-01-22 DIAGNOSIS — R229 Localized swelling, mass and lump, unspecified: Secondary | ICD-10-CM

## 2023-01-22 DIAGNOSIS — R195 Other fecal abnormalities: Secondary | ICD-10-CM

## 2023-01-22 LAB — CBC WITH DIFFERENTIAL/PLATELET
Basophils Absolute: 0 10*3/uL (ref 0.0–0.1)
Basophils Relative: 0.6 % (ref 0.0–3.0)
Eosinophils Absolute: 0.2 10*3/uL (ref 0.0–0.7)
Eosinophils Relative: 3 % (ref 0.0–5.0)
HCT: 45.5 % (ref 39.0–52.0)
Hemoglobin: 15.6 g/dL (ref 13.0–17.0)
Lymphocytes Relative: 44.1 % (ref 12.0–46.0)
Lymphs Abs: 3.1 10*3/uL (ref 0.7–4.0)
MCHC: 34.3 g/dL (ref 30.0–36.0)
MCV: 87 fl (ref 78.0–100.0)
Monocytes Absolute: 0.5 10*3/uL (ref 0.1–1.0)
Monocytes Relative: 7.1 % (ref 3.0–12.0)
Neutro Abs: 3.2 10*3/uL (ref 1.4–7.7)
Neutrophils Relative %: 45.2 % (ref 43.0–77.0)
Platelets: 243 10*3/uL (ref 150.0–400.0)
RBC: 5.22 Mil/uL (ref 4.22–5.81)
RDW: 13.8 % (ref 11.5–15.5)
WBC: 7.1 10*3/uL (ref 4.0–10.5)

## 2023-01-22 LAB — COMPREHENSIVE METABOLIC PANEL
ALT: 35 U/L (ref 0–53)
AST: 26 U/L (ref 0–37)
Albumin: 4.6 g/dL (ref 3.5–5.2)
Alkaline Phosphatase: 56 U/L (ref 39–117)
BUN: 18 mg/dL (ref 6–23)
CO2: 29 mEq/L (ref 19–32)
Calcium: 10 mg/dL (ref 8.4–10.5)
Chloride: 100 mEq/L (ref 96–112)
Creatinine, Ser: 1.04 mg/dL (ref 0.40–1.50)
GFR: 92.59 mL/min (ref >60.00)
Glucose, Bld: 88 mg/dL (ref 70–99)
Potassium: 3.6 mEq/L (ref 3.5–5.1)
Sodium: 136 mEq/L (ref 135–145)
Total Bilirubin: 0.7 mg/dL (ref 0.2–1.2)
Total Protein: 7.8 g/dL (ref 6.0–8.3)

## 2023-01-22 LAB — LIPID PANEL
Cholesterol: 235 mg/dL — ABNORMAL HIGH (ref 0–200)
HDL: 47.7 mg/dL (ref >39.00)
LDL Cholesterol: 155 mg/dL — ABNORMAL HIGH (ref 0–99)
NonHDL: 186.96
Total CHOL/HDL Ratio: 5
Triglycerides: 162 mg/dL — ABNORMAL HIGH (ref 0.0–149.0)
VLDL: 32.4 mg/dL (ref 0.0–40.0)

## 2023-01-22 LAB — HEMOGLOBIN A1C: Hgb A1c MFr Bld: 5.5 % (ref 4.6–6.5)

## 2023-01-22 LAB — TSH: TSH: 0.87 u[IU]/mL (ref 0.35–5.50)

## 2023-01-22 NOTE — Assessment & Plan Note (Addendum)
Chronic He is now not exercising regularly due to surgery Encouraged weight loss-will restart regular exercise soon now that he has recovered from surgery

## 2023-01-22 NOTE — Assessment & Plan Note (Signed)
Chronic Has loose stools predominantly.  Typically has a bowel movement daily or every other day No blood in the stool, but has seen some blood when he wipes-not sure where it is coming from Deferred exam He will monitor for now and see if there is any relation to certain foods as far as changing in bowel habits/stools He will try to determine where the blood is coming from-may be more from the pilonidal cyst or skin irritation If symptoms do not improve or persist can consider GI referral

## 2023-01-22 NOTE — Assessment & Plan Note (Signed)
Chronic Lipid panel CMP, TSH Healthy diet encouraged Continue regular exercise Encouraged weight loss

## 2023-01-22 NOTE — Assessment & Plan Note (Addendum)
Chronic S/p surgery x 2 Still healing Will need to return to surgery if there is any concerns

## 2023-01-22 NOTE — Assessment & Plan Note (Signed)
Acute Notices a few days ago Anterior, central neck near cricoid area-nontender Unsure how long it has been present No other palpable lymph nodes Possibly related to small cuts while shaving, no other recent URIs Possibly related to seasonal allergies Discussed monitoring versus evaluating further with an ultrasound but he would like to get an [facility]

## 2023-01-22 NOTE — Assessment & Plan Note (Addendum)
Chronic Check a1c Low sugar / carb diet Stressed regular exercise  

## 2023-02-19 ENCOUNTER — Ambulatory Visit
Admission: RE | Admit: 2023-02-19 | Discharge: 2023-02-19 | Disposition: A | Discharge status: Home / Self Care | Payer: BC Managed Care – PPO | Source: Ambulatory Visit | Attending: Internal Medicine | Admitting: Internal Medicine

## 2023-02-19 DIAGNOSIS — R229 Localized swelling, mass and lump, unspecified: Secondary | ICD-10-CM

## 2023-02-19 DIAGNOSIS — M2740 Unspecified cyst of jaw: Secondary | ICD-10-CM | POA: Diagnosis not present

## 2023-03-19 DIAGNOSIS — L988 Other specified disorders of the skin and subcutaneous tissue: Secondary | ICD-10-CM | POA: Diagnosis not present

## 2023-04-02 DIAGNOSIS — L988 Other specified disorders of the skin and subcutaneous tissue: Secondary | ICD-10-CM | POA: Diagnosis not present

## 2023-06-03 ENCOUNTER — Ambulatory Visit: Payer: BC Managed Care – PPO | Admitting: Physician Assistant

## 2023-06-17 ENCOUNTER — Encounter: Payer: BC Managed Care – PPO | Attending: Internal Medicine | Admitting: Internal Medicine

## 2023-06-17 DIAGNOSIS — Z87891 Personal history of nicotine dependence: Secondary | ICD-10-CM | POA: Insufficient documentation

## 2023-06-17 DIAGNOSIS — L0591 Pilonidal cyst without abscess: Secondary | ICD-10-CM | POA: Diagnosis not present

## 2023-06-21 NOTE — Progress Notes (Signed)
[place], [place] ([id number]) [id number] Page 1 of 5 Visit Report for 06/17/2023 Abuse Risk Screen Details Patient Name: Date of Service: [name], [name] 06/17/2023 9:30 A M Medical Record Number: [id number] Patient Account Number: [id number] Date of Birth/Sex: Treating RN: [birth year] (37 y.o. Judie Petit) Yevonne Pax Primary Care [name]: [name] Other Clinician: Referring Esmeralda Blanford: Treating Swade Shonka/Extender: RO BSO N, MICHA EL Georgia Duff, Isami Weeks in Treatment: 0 Abuse Risk Screen Items Answer ABUSE RISK SCREEN: Has anyone close to you tried to hurt or harm you recentlyo No Do you feel uncomfortable with anyone in your familyo No Has anyone forced you do things that you didnt want to doo No Electronic Signature(s) Signed: 06/21/2023 11:58:03 AM By: Yevonne Pax RN Entered By: Yevonne Pax on 06/17/2023 06:41:25 -------------------------------------------------------------------------------- Activities of Daily Living Details Patient Name: Date of Service: [name], [name] 06/17/2023 9:30 A M Medical Record Number: [id number] Patient Account Number: [id number] Date of Birth/Sex: Treating RN: 05-18-1986 (37 y.o. Judie Petit) Yevonne Pax Primary Care Kaymen Adrian: [name] Other Clinician: Referring Keshana Klemz: Treating Yazhini Mcaulay/Extender: RO BSO N, MICHA EL Georgia Duff, Isami Weeks in Treatment: 0 Activities of Daily Living Items Answer Activities of Daily Living (Please select one for each item) Drive Automobile Completely Able T Medications ake Completely Able Use T elephone Completely Able Care for Appearance Completely Able Use T oilet Completely Able Bath / Shower Completely Able Dress Self Completely Able Feed Self Completely Able Walk Completely Able Get In / Out Bed Completely Able Housework Completely [place], Amalia Hailey ([id number]) [id number] Page 2 of 5 Prepare Meals Completely Able Handle Money Completely  Able Shop for Self Completely Able Electronic Signature(s) Signed: 06/21/2023 11:58:03 AM By: Yevonne Pax RN Entered By: Yevonne Pax on 06/17/2023 06:41:43 -------------------------------------------------------------------------------- Education Screening Details Patient Name: Date of Service: [name], [name] 06/17/2023 9:30 A M Medical Record Number: [id number] Patient Account Number: [id number] Date of Birth/Sex: Treating RN: 09-10-1986 (37 y.o. Judie Petit) Yevonne Pax Primary Care Christien Frankl: [name] Other Clinician: Referring Livie Vanderhoof: Treating Caitlynne Harbeck/Extender: RO BSO Dorris Carnes, MICHA EL [place] Duff, Isami Weeks in Treatment: 0 Primary Learner Assessed: Patient Learning Preferences/Education Level/Primary Language Learning Preference: Explanation Highest Education Level: College or Above Preferred Language: English Cognitive Barrier Language Barrier: No Translator Needed: No Memory Deficit: No Emotional Barrier: No Cultural/Religious Beliefs Affecting Medical Care: No Physical Barrier Impaired Vision: No Impaired Hearing: No Decreased Hand dexterity: No Knowledge/Comprehension Knowledge Level: High Comprehension Level: High Ability to understand written instructions: High Ability to understand verbal instructions: High Motivation Anxiety Level: Anxious Cooperation: Cooperative Education Importance: Acknowledges Need Interest in Health Problems: Asks Questions Perception: Coherent Willingness to Engage in Self-Management High Activities: Readiness to Engage in Self-Management High Activities: Electronic Signature(s) Signed: 06/21/2023 11:58:03 AM By: Yevonne Pax RN Entered By: Yevonne Pax on 06/17/2023 06:42:12 Bolding, Alva ([id number]) [id number] Page 3 of 5 -------------------------------------------------------------------------------- Fall Risk Assessment Details Patient Name: Date of Service: [name], [name] 06/17/2023 9:30 A  M Medical Record Number: [id number] Patient Account Number: [id number] Date of Birth/Sex: Treating RN: [birth year] (37 y.o. Judie Petit) Yevonne Pax Primary Care Yanis Juma: [name] Other Clinician: Referring Daison Braxton: Treating Jeanann Balinski/Extender: RO BSO N, MICHA EL [place] Duff, Isami Weeks in Treatment: 0 Fall Risk Assessment Items Have you had 2 or more falls in the last 12 monthso 0 No Have you had any fall that resulted in injury in the last 12 monthso 0 No FALLS RISK SCREEN History of falling - immediate or within 3 months 0 No Secondary diagnosis (Do you have 2 or  more medical diagnoseso) 0 No Ambulatory aid None/bed rest/wheelchair/nurse 0 Yes Crutches/cane/walker 0 No Furniture 0 No Intravenous therapy Access/Saline/Heparin Lock 0 No Gait/Transferring Normal/ bed rest/ wheelchair 0 Yes Weak (short steps with or without shuffle, stooped but able to lift head while walking, may seek 0 No support from furniture) Impaired (short steps with shuffle, may have difficulty arising from chair, head down, impaired 0 No balance) Mental Status Oriented to own ability 0 Yes Electronic Signature(s) Signed: 06/21/2023 11:58:03 AM By: Yevonne Pax RN Entered By: Yevonne Pax on 06/17/2023 06:42:21 -------------------------------------------------------------------------------- Foot Assessment Details Patient Name: Date of Service: [name], [name] 06/17/2023 9:30 A M Medical Record Number: [id number] Patient Account Number: [id number] Date of Birth/Sex: Treating RN: [birth year] (37 y.o. Melonie [place] Primary Care Capone Schwinn: [name] Other Clinician: Referring Bobby Barton: Treating Malissia Rabbani/Extender: RO BSO N, MICHA EL [place] Duff, Isami Weeks in Treatment: 0 Foot Assessment Items Site Locations [place], [place] ([id number]) [id number] Page 4 of 5 + = Sensation present, - = Sensation absent, C = Callus, U = Ulcer R = Redness, W = Warmth, M = Maceration, PU =  Pre-ulcerative lesion F = Fissure, S = Swelling, D = Dryness Assessment Right: Left: Other Deformity: No No Prior Foot Ulcer: No No Prior Amputation: No No Charcot Joint: No No Ambulatory Status: Ambulatory Without Help Gait: Steady Electronic Signature(s) Signed: 06/21/2023 11:58:03 AM By: Yevonne Pax RN Entered By: Yevonne Pax on 06/17/2023 06:42:40 -------------------------------------------------------------------------------- Nutrition Risk Screening Details Patient Name: Date of Service: [name], [name] 06/17/2023 9:30 A M Medical Record Number: [id number] Patient Account Number: [id number] Date of Birth/Sex: Treating RN: 06/07/1986 (37 y.o. Judie Petit) Yevonne Pax Primary Care Dohn Stclair: [name] Other Clinician: Referring Kamela Blansett: Treating Akif Weldy/Extender: RO BSO N, MICHA EL [place] Duff, Isami Weeks in Treatment: 0 Height (in): 67 Weight (lbs): 260 Body Mass Index (BMI): 40.7 Nutrition Risk Screening Items Score Screening NUTRITION RISK SCREEN: I have an illness or condition that made me change the kind and/or amount of food I eat 0 No I eat fewer than two meals per day 0 No I eat few fruits and vegetables, or milk products 0 No I have three or more drinks of beer, liquor or wine almost every day 0 No I have tooth or mouth problems that make it hard for me to eat 0 No I don't always have enough money to buy the food I need 0 No Zortman, Brain ([id number]) [id number] Page 5 of 5 I eat alone most of the time 0 No I take three or more different prescribed or over-the-counter drugs a day 0 No Without wanting to, I have lost or gained 10 pounds in the last six months 0 No I am not always physically able to shop, cook and/or feed myself 0 No Nutrition Protocols Good Risk Protocol 0 No interventions needed Moderate Risk Protocol High Risk Proctocol Risk Level: Good Risk Score: 0 Electronic Signature(s) Signed: 06/21/2023 11:58:03 AM By:  Yevonne Pax RN Entered By: Yevonne Pax on 06/17/2023 06:42:31

## 2023-06-21 NOTE — Progress Notes (Signed)
[place], [name] ([id number]) [id number] Page 1 of 6 Visit Report for 06/17/2023 Chief Complaint Document Details Patient Name: Date of Service: [name], [name] 06/17/2023 9:30 A M Medical Record Number: [id number] Patient Account Number: [id number] Date of Birth/Sex: Treating RN: 1985/12/23 (37 y.o. Judie Petit) Yevonne Pax Primary Care Provider: Cheryll Cockayne Other Clinician: Referring Provider: Treating Provider/Extender: [name], [name] Georgia Duff, [name]: 0 Patient / Caregiver unable to provide history. Chief Complaint Disruption of pilonidal cyst removal 06/17/2023; patient is here for review of his pilonidal cyst area which was operated on in February of this year Electronic Signature(s) Signed: 06/17/2023 4:34:10 PM By: Baltazar Najjar MD Entered By: Baltazar Najjar on 06/17/2023 07:07:50 -------------------------------------------------------------------------------- HPI Details Patient Name: Date of Service: [name], [name] 06/17/2023 9:30 A M Medical Record Number: [id number] Patient Account Number: [id number] Date of Birth/Sex: Treating RN: July 16, 1986 (37 y.o. Melonie [place] Primary Care Provider: Cheryll Cockayne Other Clinician: Referring Provider: Treating Provider/Extender: [name], [name] Georgia Duff, [name]: 0 History of Present Illness HPI Description: 11/20/2021 upon evaluation patient presents for initial inspection here in our clinic concerning a history of having had a pilonidal cyst summer 2022. Following that time he was told that it could take up to 6 months for this to heal therefore he is waited until now which was the 62-month mark to come in to be seen here at the wound care center. Unfortunately I am just not certain that this has been healing as well from an internal perspective it is what should be and to be honest it still draining but again there is significant depth to the wound that really has not been  packed appropriately up to this point. I do believe that we do need to address that today. The good news is he is otherwise fairly healthy there is no signs of systemic infection and locally the infection seems to be pretty well controlled at this time. I do not see significant purulent drainage although I do see quite a bit of drainage in the internal aspect of this is very friable currently. 11/27/2021 upon evaluation today patient appears to be doing about the same in regard to his wound. Fortunately I do not see anything that seems to be worse or truly infected but unfortunately he still is very friable and having a lot of bleeding even with just light cleaning using a Q-tip and gauze. 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 He does have some what appears to be potentially swelling although it may just be scarring to the side on the right of the gluteal region just lateral to the sacrum. His wife who is here today states that she feels like that has been that way since the surgery but she cannot be 100% sure. Nonetheless in general I am concerned about the fact that this probably needs to be changed more frequently I think even daily and they are getting start doing this at home on their own when she is good to help him. I do believe we may want to see about an ultrasound however to see if there is any evidence of an ongoing pilonidal cyst here. [place], [name] ([id number]) [id number] Page 2 of 6 12/04/2021 upon evaluation today patient actually appears to be doing better in regard to the original wound. He has his ultrasound scheduled for the end of this week. He may have to  reschedule that however as it appears that he is managing may not be in network for him which is unfortunate. Nonetheless I am encouraged by what I am seeing today going to the wound in general. He does have some excoriation  of the below I thought initially this may have been drainage but I think it might have actually been when he was shaving that he actually caused some irritation and breakdown of this region. 12/11/2021 upon evaluation today patient appears to actually be doing much better in regard to his wound. The area of irritation down below was actually looking better as well although it still definitely there. I do believe that he may be better to use something like zinc in between this region and then just use a gauze packed in behind the packing for the [facility]. Overall I think that his wife and he are doing a great job with this wound in general. In fact it is doing so well I do not believe the ultrasound is in good to be necessary is good considering that he cannot even get any solid answers from his insurance company about whether they will cover it or even if the location where this is scheduled is even in network. 12/25/2021 upon evaluation today patient appears to be doing excellent in fact he appears to be completely healed based on what we are seeing in. Fortunately there does not appear to be any evidence of active infection locally nor systemically at this time which is great news. Readmission: 11-13-2022 upon evaluation today patient presents for reevaluation here in the clinic due to issues that he is having with the area of pilonidal cyst that previously was removed and evacuated in the summer 2022. I initially had seen him last year in January 2023. At that point there was a question as to whether or not they had gotten everything out or if this was continuing to be an issue as far as the pilonidal cyst was concerned. Subsequently we had a discussion and we did treat him along the way and the patient tells me that he is did stay closed from the time I discharged him in March until sometime right around the beginning of summer. Nonetheless it has been open and draining again since that time he  is obviously pretty frustrated with the situation which I can completely understand. He does not really want to see the previous surgeon that did this he would like to see somebody different as it appears is probably can either repeat surgery. He actually had some hair that was removed from the wound opening today that we actually took pictures of it as well this had actually been embedded down in the wound and when the nurse was cleaning and changing the dressing she actually was able to remove these with tweezers. Nonetheless the patient has a picture of this we also took a picture for our records here in the clinic as well. Either way I do believe that the patient is can require probably surgical intervention here and I am to see about referring him to either Dr. Maia Plan or Dr. Tonna Boehringer and see if there is anything they can do to help him out. He is in agreement with this plan and is greatly appreciative. READMISSION 06/17/2023 Patient returns to our clinic apparently at the insistence of his wife. She wanted to determine that this area was actually healed. After the patient was last here in January of this year he had extensive pilonidal cyst  surgery by Dr. Tonna Boehringer on 12/20/2022. He was followed episodically in the surgical office in follow-up. On 04/02/2023 the wound was still open at the caudal aspect. He was using Mepilex. At that point there was a question of recurrence versus simple wound dehiscence. The appointment was made for him to come here. In the meantime this is actually closed. He came here for [place] to pronounce a closed at the insistence of his wife Electronic Signature(s) Signed: 06/17/2023 4:34:10 PM By: Baltazar Najjar MD Entered By: Baltazar Najjar on 06/17/2023 07:09:33 -------------------------------------------------------------------------------- Physical Exam Details Patient Name: Date of Service: [name], [name] 06/17/2023 9:30 A M Medical Record Number: [id number] Patient  Account Number: [id number] Date of Birth/Sex: Treating RN: 1986/03/20 (37 y.o. Melonie [place] Primary Care Provider: Cheryll Cockayne Other Clinician: Referring Provider: Treating Provider/Extender: [name], [name] Georgia Duff, [name]: 0 Constitutional Patient is hypertensive.. Pulse regular and within target range for patient.Marland Kitchen Respirations regular, non-labored and within target range.. Temperature is normal and within the target range for the patient.Marland Kitchen appears in no distress. Notes Wound exam; there is no open wound in the superior aspect of the gluteal cleft everything is healed here. No drainage no erythema no palpable tenderness or crepitus. He does have a lot of hair in this area Electronic Signature(s) Signed: 06/17/2023 4:34:10 PM By: Baltazar Najjar MD Entered By: Baltazar Najjar on 06/17/2023 07:10:58 [place], [name] ([id number]) [id number] Page 3 of 6 -------------------------------------------------------------------------------- Physician Orders Details Patient Name: Date of Service: NASAIR, IWEN 06/17/2023 9:30 A M Medical Record Number: [id number] Patient Account Number: [id number] Date of Birth/Sex: Treating RN: 08/17/1986 (37 y.o. Judie Petit) Yevonne Pax Primary Care Provider: Cheryll Cockayne Other Clinician: Referring Provider: Treating Provider/Extender: [name], [name] Georgia Duff, [name]: 0 Verbal / Phone Orders: No Diagnosis Coding Discharge From [facility] Services Consult Only Electronic Signature(s) Signed: 06/17/2023 4:34:10 PM By: Baltazar Najjar MD Signed: 06/21/2023 11:58:03 AM By: Yevonne Pax RN Entered By: Yevonne Pax on 06/17/2023 07:00:38 -------------------------------------------------------------------------------- Problem List Details Patient Name: Date of Service: [name], BRONISLAUS 06/17/2023 9:30 A M Medical Record Number: [id number] Patient Account Number: [id number] Date of Birth/Sex: Treating  RN: 12-20-1985 (37 y.o. Melonie [place] Primary Care Provider: Cheryll Cockayne Other Clinician: Referring Provider: Treating Provider/Extender: [name], [name] Georgia Duff, [name]: 0 Active Problems ICD-10 Encounter Code Description Active Date MDM Diagnosis L05.91 Pilonidal cyst without abscess 06/17/2023 No Yes Inactive Problems Resolved Problems Electronic Signature(s) Signed: 06/17/2023 4:34:10 PM By: Baltazar Najjar MD Entered By: Baltazar Najjar on 06/17/2023 07:06:45 [name] ([id number]) [id number] Page 4 of 6 -------------------------------------------------------------------------------- Progress Note Details Patient Name: Date of Service: RIAL, SABIR 06/17/2023 9:30 A M Medical Record Number: [id number] Patient Account Number: [id number] Date of Birth/Sex: Treating RN: 11-Aug-1986 (37 y.o. Judie Petit) Yevonne Pax Primary Care Provider: Cheryll Cockayne Other Clinician: Referring Provider: Treating Provider/Extender: [name], [name] Georgia Duff, [name]: 0 Subjective Chief Complaint Patient / Caregiver unable to provide history Disruption of pilonidal cyst removal 06/17/2023; patient is here for review of his pilonidal cyst area which was operated on in February of this year History of Present Illness (HPI) 11/20/2021 upon evaluation patient presents for initial inspection here in our clinic concerning a history of having had a pilonidal cyst summer 2022. Following that time he was told that it could take up to 6 months for this to heal therefore he is waited until now which was the 56-month  mark to come in to be seen here at the wound care center. Unfortunately I am just not certain that this has been healing as well from an internal perspective it is what should be and to be honest it still draining but again there is significant depth to the wound that really has not been packed appropriately up to this point. I do  believe that we do need to address that today. The good news is he is otherwise fairly healthy there is no signs of systemic infection and locally the infection seems to be pretty well controlled at this time. I do not see significant purulent drainage although I do see quite a bit of drainage in the internal aspect of this is very friable currently. 11/27/2021 upon evaluation today patient appears to be doing about the same in regard to his wound. Fortunately I do not see anything that seems to be worse or truly infected but unfortunately he still is very friable and having a lot of bleeding even with just light cleaning using a Q-tip and gauze. 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 He does have some what appears to be potentially swelling although it may just be scarring to the side on the right of the gluteal region just lateral to the sacrum. His wife who is here today states that she feels like that has been that way since the surgery but she cannot be 100% sure. Nonetheless in general I am concerned about the fact that this probably needs to be changed more frequently I think even daily and they are getting start doing this at home on their own when she is good to help him. I do believe we may want to see about an ultrasound however to see if there is any evidence of an ongoing pilonidal cyst here. 12/04/2021 upon evaluation today patient actually appears to be doing better in regard to the original wound. He has his ultrasound scheduled for the end of this week. He may have to reschedule that however as it appears that he is managing may not be in network for him which is unfortunate. Nonetheless I am encouraged by what I am seeing today going to the wound in general. He does have some excoriation of the below I thought initially this may have been drainage but I think it might have actually been when he was shaving that  he actually caused some irritation and breakdown of this region. 12/11/2021 upon evaluation today patient appears to actually be doing much better in regard to his wound. The area of irritation down below was actually looking better as well although it still definitely there. I do believe that he may be better to use something like zinc in between this region and then just use a gauze packed in behind the packing for the [facility]. Overall I think that his wife and he are doing a great job with this wound in general. In fact it is doing so well I do not believe the ultrasound is in good to be necessary is good considering that he cannot even get any solid answers from his insurance company about whether they will cover it or even if the location where this is scheduled is even in network. 12/25/2021 upon evaluation today patient appears to be doing excellent in fact he appears to be completely healed based on what we are seeing in. Fortunately there does not appear  to be any evidence of active infection locally nor systemically at this time which is great news. Readmission: 11-13-2022 upon evaluation today patient presents for reevaluation here in the clinic due to issues that he is having with the area of pilonidal cyst that previously was removed and evacuated in the summer 2022. I initially had seen him last year in January 2023. At that point there was a question as to whether or not they had gotten everything out or if this was continuing to be an issue as far as the pilonidal cyst was concerned. Subsequently we had a discussion and we did treat him along the way and the patient tells me that he is did stay closed from the time I discharged him in March until sometime right around the beginning of summer. Nonetheless it has been open and draining again since that time he is obviously pretty frustrated with the situation which I can completely understand. He does not really want to see the  previous surgeon that did this he would like to see somebody different as it appears is probably can either repeat surgery. He actually had some hair that was removed from the wound opening today that we actually took pictures of it as well this had actually been embedded down in the wound and when the nurse was cleaning and changing the dressing she actually was able to remove these with tweezers. Nonetheless the patient has a picture of this we also took a picture for our records here in the clinic as well. Either way I do believe that the patient is can require probably surgical intervention here and I am to see about referring him to either Dr. Maia Plan or Dr. Tonna Boehringer and see if there is anything they can do to help him out. He is in agreement with this plan and is greatly appreciative. READMISSION 06/17/2023 Patient returns to our clinic apparently at the insistence of his wife. She wanted to determine that this area was actually healed. After the patient was last here in January of this year he had extensive pilonidal cyst surgery by Dr. Tonna Boehringer on 12/20/2022. He was followed episodically in the surgical office in follow-up. On 04/02/2023 the wound was still open at the caudal aspect. He was using Mepilex. At that point there was a question of recurrence versus simple wound dehiscence. The appointment was made for him to come here. In the meantime this is actually closed. He came here for [place] to pronounce a closed at the insistence of his wife Patient History Information obtained from Patient. [name], [name] ([id number]) [id number] Page 5 of 6 Allergies No Known Drug Allergies Social History Former smoker - ended on 10/22/2010, Marital Status - Married, Alcohol Use - Moderate, Drug Use - No History, Caffeine Use - Daily - coffee. Objective Constitutional Patient is hypertensive.. Pulse regular and within target range for patient.Marland Kitchen Respirations regular, non-labored and  within target range.. Temperature is normal and within the target range for the patient.Marland Kitchen appears in no distress. Vitals Time Taken: 9:40 AM, Height: 67 in, Source: Stated, Weight: 260 lbs, Source: Stated, BMI: 40.7, Temperature: 97.8 F, Pulse: 77 bpm, Respiratory Rate: 18 breaths/min, Blood Pressure: 156/97 mmHg. General Notes: Wound exam; there is no open wound in the superior aspect of the gluteal cleft everything is healed here. No drainage no erythema no palpable tenderness or crepitus. He does have a lot of hair in this area Assessment Active Problems ICD-10 Pilonidal cyst without abscess Plan Discharge From [facility] Services: Consult Only  1. Everything is healed which is gratifying to see 2. We had some discussion about secondary prevention. It has already been suggested to him that he consider laser hair removal which is about the only thing that I had to add. We talked about careful attention to excessive pressure over this area etc. Electronic Signature(s) Signed: 06/17/2023 4:34:10 PM By: Baltazar Najjar MD Entered By: Baltazar Najjar on 06/17/2023 07:12:12 -------------------------------------------------------------------------------- ROS/PFSH Details Patient Name: Date of Service: [name], [name] 06/17/2023 9:30 A M Medical Record Number: [id number] Patient Account Number: [id number] Date of Birth/Sex: Treating RN: 1986-08-20 ([address] y.o. Melonie [place], [place] ([id number]) [id number] Page 6 of 6 Primary Care Provider: Cheryll Cockayne Other Clinician: Referring Provider: Treating Provider/Extender: [name], [name] Georgia Duff, [name]: 0 Information Obtained From Patient Immunizations Pneumococcal Vaccine: Received Pneumococcal Vaccination: No Tetanus Vaccine: Last tetanus shot: 10/23/2011 Implantable Devices None Family and Social History Former smoker - ended on 10/22/2010; Marital Status - Married; Alcohol Use: Moderate;  Drug Use: No History; Caffeine Use: Daily - coffee Electronic Signature(s) Signed: 06/17/2023 4:34:10 PM By: Baltazar Najjar MD Signed: 06/21/2023 11:58:03 AM By: Yevonne Pax RN Entered By: Yevonne Pax on 06/17/2023 06:41:19 -------------------------------------------------------------------------------- SuperBill Details Patient Name: Date of Service: KEYNEN, CLEARY 06/17/2023 Medical Record Number: [id number] Patient Account Number: [id number] Date of Birth/Sex: Treating RN: Dec 12, 1985 (37 y.o. Melonie [place] Primary Care Provider: Cheryll Cockayne Other Clinician: Referring Provider: Treating Provider/Extender: [name], [name] Georgia Duff, [name]: 0 Diagnosis Coding ICD-10 Codes Code Description L05.91 Pilonidal cyst without abscess Facility Procedures : CPT4 Code: [id number] Description: 99213 - WOUND CARE VISIT-LEV 3 EST PT Modifier: Quantity: 1 Physician Procedures : CPT4 Code Description Modifier [id number] 99213 - WC PHYS LEVEL 3 - EST PT ICD-10 Diagnosis Description L05.91 Pilonidal cyst without abscess Quantity: 1 Electronic Signature(s) Signed: 06/17/2023 4:34:10 PM By: Baltazar Najjar MD Entered By: Baltazar Najjar on 06/17/2023 07:12:36

## 2023-06-21 NOTE — Progress Notes (Signed)
[place], Amalia Hailey ([id number]) [id number] Page 1 of 7 Visit Report for 06/17/2023 Allergy List Details Patient Name: Date of Service: TIERRE, HELLYER 06/17/2023 9:30 A M Medical Record Number: [id number] Patient Account Number: [id number] Date of Birth/Sex: Treating RN: Jan 16, 1986 (37 y.o. Judie Petit) Yevonne Pax Primary Care Emnet Monk: Cheryll Cockayne Other Clinician: Referring Latrisa Hellums: Treating Randee Huston/Extender: RO BSO N, MICHA EL Georgia Duff, Isami Weeks in Treatment: 0 Allergies Active Allergies No Known Drug Allergies Allergy Notes Electronic Signature(s) Signed: 06/21/2023 11:58:03 AM By: Yevonne Pax RN Entered By: Yevonne Pax on 06/17/2023 06:41:11 -------------------------------------------------------------------------------- Arrival Information Details Patient Name: Date of Service: [name], Marquavious 06/17/2023 9:30 A M Medical Record Number: [id number] Patient Account Number: [id number] Date of Birth/Sex: Treating RN: [birth year] (37 y.o. Judie Petit) Yevonne Pax Primary Care Asma Boldon: Cheryll Cockayne Other Clinician: Referring Lolamae Voisin: Treating Matin Mattioli/Extender: RO BSO N, MICHA EL Georgia Duff, Isami Weeks in Treatment: 0 Visit Information Patient Arrived: Ambulatory Arrival Time: 09:35 Accompanied By: self Transfer Assistance: None Patient Identification Verified: Yes Secondary Verification Process Completed: Yes Patient Requires Transmission-Based Precautions: No Patient Has Alerts: No TARVARIS, SLY ([id number]) Electronic Signature(s) Signed: 06/21/2023 11:58:03 AM By: Yevonne Pax RN Entered By: Karma Lew History Since Last Visit Added or deleted any medications: No Any new allergies or adverse reactions: No Had a fall or experienced change in activities of daily living that may affect risk of falls: No Signs or symptoms of abuse/neglect since last visito No Hospitalized since last visit: No Implantable device outside of the clinic excluding cellular tissue  based products placed in the center since last visit: No Has Dressing in Place as Prescribed: Yes Pain Present Now: No [id number] Page 2 of 7 rie on 06/17/2023 06:39:55 -------------------------------------------------------------------------------- Clinic Level of Care Assessment Details Patient Name: Date of Service: GUNNER, MONTALBANO 06/17/2023 9:30 A M Medical Record Number: [id number] Patient Account Number: [id number] Date of Birth/Sex: Treating RN: July 14, 1986 (37 y.o. Judie Petit) Yevonne Pax Primary Care Tejal Monroy: Cheryll Cockayne Other Clinician: Referring Gennifer Potenza: Treating Alaysiah Browder/Extender: RO BSO N, MICHA EL Georgia Duff, Isami Weeks in Treatment: 0 Clinic Level of Care Assessment Items TOOL 4 Quantity Score X- 1 0 Use when only an EandM is performed on FOLLOW-UP visit ASSESSMENTS - Nursing Assessment / Reassessment X- 1 10 Reassessment of Co-morbidities (includes updates in patient status) X- 1 5 Reassessment of Adherence to Treatment Plan ASSESSMENTS - Wound and Skin A ssessment / Reassessment X - Simple Wound Assessment / Reassessment - one wound 1 5 []  - 0 Complex Wound Assessment / Reassessment - multiple wounds []  - 0 Dermatologic / Skin Assessment (not related to wound area) ASSESSMENTS - Focused Assessment []  - 0 Circumferential Edema Measurements - multi extremities []  - 0 Nutritional Assessment / Counseling / Intervention []  - 0 Lower Extremity Assessment (monofilament, tuning fork, pulses) []  - 0 Peripheral Arterial Disease Assessment (using hand held doppler) ASSESSMENTS - Ostomy and/or Continence Assessment and Care []  - 0 Incontinence Assessment and Management []  - 0 Ostomy Care Assessment and Management (repouching, etc.) PROCESS - Coordination of Care X - Simple Patient / Family Education for ongoing care 1 15 []  - 0 Complex (extensive) Patient / Family Education for ongoing care []  - 0 Staff obtains Chiropractor, Records, T Results /  Process Orders est []  - 0 Staff telephones HHA, Nursing Homes / Clarify orders / etc []  - 0 Routine Transfer to another Facility (non-emergent condition) []  - 0 Routine Hospital Admission (non-emergent condition) X- 1 15 New Admissions / Manufacturing engineer / Ordering NPWT Apligraf, etc. , []  -  0 Emergency Hospital Admission (emergent condition) X- 1 10 Simple Discharge Coordination []  - 0 Complex (extensive) Discharge Coordination PROCESS - Special Needs KEDEN, RISINGER ([id number]) [id number] Page 3 of 7 []  - 0 Pediatric / Minor Patient Management []  - 0 Isolation Patient Management []  - 0 Hearing / Language / Visual special needs []  - 0 Assessment of Community assistance (transportation, D/C planning, etc.) []  - 0 Additional assistance / Altered mentation []  - 0 Support Surface(s) Assessment (bed, cushion, seat, etc.) INTERVENTIONS - Wound Cleansing / Measurement X - Simple Wound Cleansing - one wound 1 5 []  - 0 Complex Wound Cleansing - multiple wounds X- 1 5 Wound Imaging (photographs - any number of wounds) []  - 0 Wound Tracing (instead of photographs) X- 1 5 Simple Wound Measurement - one wound []  - 0 Complex Wound Measurement - multiple wounds INTERVENTIONS - Wound Dressings X - Small Wound Dressing one or multiple wounds 1 10 []  - 0 Medium Wound Dressing one or multiple wounds []  - 0 Large Wound Dressing one or multiple wounds X- 1 5 Application of Medications - topical []  - 0 Application of Medications - injection INTERVENTIONS - Miscellaneous []  - 0 External ear exam []  - 0 Specimen Collection (cultures, biopsies, blood, body fluids, etc.) []  - 0 Specimen(s) / Culture(s) sent or taken to Lab for analysis []  - 0 Patient Transfer (multiple staff / Nurse, adult / Similar devices) []  - 0 Simple Staple / Suture removal (25 or less) []  - 0 Complex Staple / Suture removal (26 or more) []  - 0 Hypo / Hyperglycemic  Management (close monitor of Blood Glucose) []  - 0 Ankle / Brachial Index (ABI) - do not check if billed separately X- 1 5 Vital Signs Has the patient been seen at the hospital within the last three years: Yes Total Score: 95 Level Of Care: New/Established - Level 3 Electronic Signature(s) Signed: 06/21/2023 11:58:03 AM By: Yevonne Pax RN Entered By: Yevonne Pax on 06/17/2023 07:01:49 -------------------------------------------------------------------------------- Encounter Discharge Information Details Patient Name: Date of Service: [name], Amado 06/17/2023 9:30 A M Medical Record Number: [id number] Patient Account Number: [id number] FATIMA, SHIMOMURA ([id number]) [id number] Page 4 of 7 Date of Birth/Sex: Treating RN: 01-13-1986 (37 y.o. Judie Petit) Yevonne Pax Primary Care Jasper Hanf: Other Clinician: Cheryll Cockayne Referring Rainen Vanrossum: Treating Milayah Krell/Extender: RO BSO Dorris Carnes, MICHA EL Georgia Duff, Isami Weeks in Treatment: 0 Encounter Discharge Information Items Discharge Condition: Stable Ambulatory Status: Ambulatory Discharge Destination: Home Transportation: Private Auto Accompanied By: self Schedule Follow-up Appointment: Yes Clinical Summary of Care: Electronic Signature(s) Signed: 06/21/2023 11:58:03 AM By: Yevonne Pax RN Entered By: Yevonne Pax on 06/17/2023 07:02:50 -------------------------------------------------------------------------------- Lower Extremity Assessment Details Patient Name: Date of Service: ADRAIN, GIACOMO 06/17/2023 9:30 A M Medical Record Number: [id number] Patient Account Number: [id number] Date of Birth/Sex: Treating RN: 29-Aug-1986 (37 y.o. Melonie [place] Primary Care Carlisia Geno: Cheryll Cockayne Other Clinician: Referring Liam Bossman: Treating Valita Righter/Extender: RO BSO N, MICHA EL Georgia Duff, Isami Weeks in Treatment: 0 Electronic Signature(s) Signed: 06/21/2023 11:58:03 AM By: Yevonne Pax RN Entered By: Yevonne Pax on 06/17/2023  06:40:58 -------------------------------------------------------------------------------- Multi Wound Chart Details Patient Name: Date of Service: [name], MARLOWE 06/17/2023 9:30 A M Medical Record Number: [id number] Patient Account Number: [id number] Date of Birth/Sex: Treating RN: 10-21-1986 (37 y.o. Melonie [place] Primary Care Anslie Spadafora: Cheryll Cockayne Other Clinician: Referring Chaunte Hornbeck: Treating Worth Kober/Extender: RO BSO N, MICHA EL Georgia Duff, Isami Weeks in Treatment: 0 Vital Signs Height(in): 67 Pulse(bpm): 77 Weight(lbs): 260 Blood Pressure(mmHg): 156/97 Body Mass Index(BMI):  40.7 Temperature(F): 97.8 Respiratory Rate(breaths/min): 18 [place], [place] ([id number]) [id number] Page 5 of 7 [Treatment Notes:Wound Assessments Treatment Notes] Electronic Signature(s) Signed: 06/21/2023 11:58:03 AM By: Yevonne Pax RN Entered By: Yevonne Pax on 06/17/2023 06:42:43 -------------------------------------------------------------------------------- Multi-Disciplinary Care Plan Details Patient Name: Date of Service: [name], AZARIAN 06/17/2023 9:30 A M Medical Record Number: [id number] Patient Account Number: [id number] Date of Birth/Sex: Treating RN: 1986-04-03 (37 y.o. Melonie [place] Primary Care Dennice Tindol: Cheryll Cockayne Other Clinician: Referring Deniese Oberry: Treating Dynesha Woolen/Extender: RO BSO N, MICHA EL Georgia Duff, Isami Weeks in Treatment: 0 Active Inactive Electronic Signature(s) Signed: 06/21/2023 11:58:03 AM By: Yevonne Pax RN Entered By: Yevonne Pax on 06/17/2023 06:43:07 -------------------------------------------------------------------------------- Pain Assessment Details Patient Name: Date of Service: VITALY, JASMER 06/17/2023 9:30 A M Medical Record Number: [id number] Patient Account Number: [id number] Date of Birth/Sex: Treating RN: 1986-09-28 (37 y.o. Melonie [place] Primary Care Azalea Cedar: Cheryll Cockayne Other Clinician: Referring  Gaynor Ferreras: Treating Karlo Goeden/Extender: RO BSO N, MICHA EL Georgia Duff, Isami Weeks in Treatment: 0 Active Problems Location of Pain Severity and Description of Pain Patient Has Paino No Site Locations [place], [place] ([id number]) [id number] Page 6 of 7 Pain Management and Medication Current Pain Management: Electronic Signature(s) Signed: 06/21/2023 11:58:03 AM By: Yevonne Pax RN Entered By: Yevonne Pax on 06/17/2023 06:40:03 -------------------------------------------------------------------------------- Patient/Caregiver Education Details Patient Name: Date of Service: [name], Jesper 8/26/2024andnbsp9:30 A M Medical Record Number: [id number] Patient Account Number: [id number] Date of Birth/Gender: Treating RN: [birth year] (37 y.o. Melonie [place] Primary Care Physician: Cheryll Cockayne Other Clinician: Referring Physician: Treating Physician/Extender: RO BSO N, MICHA EL Georgia Duff, Isami Weeks in Treatment: 0 Education Assessment Education Provided To: Patient Education Topics Provided Welcome [facility]New [facility] Packet: o Handouts: Welcome T [facility] o Methods: Explain/Verbal Responses: State content correctly Electronic Signature(s) Signed: 06/21/2023 11:58:03 AM By: Yevonne Pax RN Entered By: Yevonne Pax on 06/17/2023 06:43:20 [place], [place] ([id number]) [id number] Page 7 of 7 -------------------------------------------------------------------------------- Vitals Details Patient Name: Date of Service: ROMEY, MARTENSEN 06/17/2023 9:30 A M Medical Record Number: [id number] Patient Account Number: [id number] Date of Birth/Sex: Treating RN: 07-21-1986 (37 y.o. Judie Petit) Yevonne Pax Primary Care Leatta Alewine: Cheryll Cockayne Other Clinician: Referring Jerrianne Hartin: Treating Sherlene Rickel/Extender: RO BSO N, MICHA EL Georgia Duff, Isami Weeks in Treatment: 0 Vital Signs Time Taken: 09:40 Temperature (F): 97.8 Height (in):  67 Pulse (bpm): 77 Source: Stated Respiratory Rate (breaths/min): 18 Weight (lbs): 260 Blood Pressure (mmHg): 156/97 Source: Stated Reference Range: 80 - 120 mg / dl Body Mass Index (BMI): 40.7 Electronic Signature(s) Signed: 06/21/2023 11:58:03 AM By: Yevonne Pax RN Entered By: Yevonne Pax on 06/17/2023 06:40:42

## 2024-03-12 ENCOUNTER — Encounter: Payer: Self-pay | Admitting: Internal Medicine

## 2024-03-12 DIAGNOSIS — K921 Melena: Secondary | ICD-10-CM

## 2024-04-07 ENCOUNTER — Encounter: Payer: Self-pay | Admitting: Gastroenterology

## 2024-04-09 ENCOUNTER — Encounter: Payer: Self-pay | Admitting: Gastroenterology

## 2024-04-09 ENCOUNTER — Ambulatory Visit: Admitting: Gastroenterology

## 2024-04-09 ENCOUNTER — Other Ambulatory Visit (INDEPENDENT_AMBULATORY_CARE_PROVIDER_SITE_OTHER)

## 2024-04-09 ENCOUNTER — Ambulatory Visit: Payer: Self-pay | Admitting: Gastroenterology

## 2024-04-09 VITALS — BP 130/88 | HR 112 | Ht 67.0 in | Wt 289.0 lb

## 2024-04-09 DIAGNOSIS — R194 Change in bowel habit: Secondary | ICD-10-CM

## 2024-04-09 DIAGNOSIS — K625 Hemorrhage of anus and rectum: Secondary | ICD-10-CM | POA: Diagnosis not present

## 2024-04-09 DIAGNOSIS — Z800 Family history of malignant neoplasm of digestive organs: Secondary | ICD-10-CM | POA: Diagnosis not present

## 2024-04-09 LAB — CBC WITH DIFFERENTIAL/PLATELET
Basophils Absolute: 0.1 10*3/uL (ref 0.0–0.1)
Basophils Relative: 0.9 % (ref 0.0–3.0)
Eosinophils Absolute: 0.3 10*3/uL (ref 0.0–0.7)
Eosinophils Relative: 3.2 % (ref 0.0–5.0)
HCT: 47.2 % (ref 39.0–52.0)
Hemoglobin: 15.9 g/dL (ref 13.0–17.0)
Lymphocytes Relative: 38 % (ref 12.0–46.0)
Lymphs Abs: 3 10*3/uL (ref 0.7–4.0)
MCHC: 33.6 g/dL (ref 30.0–36.0)
MCV: 84.7 fl (ref 78.0–100.0)
Monocytes Absolute: 0.4 10*3/uL (ref 0.1–1.0)
Monocytes Relative: 5.7 % (ref 3.0–12.0)
Neutro Abs: 4.1 10*3/uL (ref 1.4–7.7)
Neutrophils Relative %: 52.2 % (ref 43.0–77.0)
Platelets: 259 10*3/uL (ref 150.0–400.0)
RBC: 5.58 Mil/uL (ref 4.22–5.81)
RDW: 13.9 % (ref 11.5–15.5)
WBC: 7.8 10*3/uL (ref 4.0–10.5)

## 2024-04-09 MED ORDER — NA SULFATE-K SULFATE-MG SULF 17.5-3.13-1.6 GM/177ML PO SOLN: 1.0000 | ORAL | 0 refills | Status: DC

## 2024-04-09 NOTE — Patient Instructions (Signed)
 We have sent the following medications to your pharmacy for you to pick up at your convenience: Suprep  Your provider has requested that you go to the basement level for lab work before leaving today. Press B on the elevator. The lab is located at the first door on the left as you exit the elevator.  Due to recent changes in healthcare laws, you may see the results of your imaging and laboratory studies on MyChart before your provider has had a chance to review them.  We understand that in some cases there may be results that are confusing or concerning to you. Not all laboratory results come back in the same time frame and the provider may be waiting for multiple results in order to interpret others.  Please give us  48 hours in order for your provider to thoroughly review all the results before contacting the office for clarification of your results.   You have been scheduled for a colonoscopy. Please follow written instructions given to you at your visit today.   If you use inhalers (even only as needed), please bring them with you on the day of your procedure.  DO NOT TAKE 7 DAYS PRIOR TO TEST- Trulicity (dulaglutide) Ozempic, Wegovy (semaglutide) Mounjaro (tirzepatide) Bydureon Bcise (exanatide extended release)  DO NOT TAKE 1 DAY PRIOR TO YOUR TEST Rybelsus (semaglutide) Adlyxin (lixisenatide) Victoza (liraglutide) Byetta (exanatide) ___________________________________________________________________________   Thank you for choosing me and [facility].

## 2024-04-09 NOTE — Progress Notes (Signed)
 [name] [id number] 04-09-1986   Chief Complaint: Rectal bleeding  Referring Provider: Colene Dauphin, MD Primary GI MD: Para Bold  HPI: [name] is a 38 y.o. male with past medical history of pilonidal cyst (s/p excision 2022, 2024), obesity who presents today for a complaint of rectal bleeding.  At visit with PCP 01/2023, patient noted to have predominantly loose stools, sometimes notices blood when he wipes.  Labs 01/22/2023: Normal CBC, hemoglobin A1c 5.5, TSH 0.87, elevated cholesterol/triglycerides/LDL, normal CMP   Patient states he has been seeing blood predominantly on the toilet paper after bowel movements intermittently for the last 3 months.  He is unsure if there has been any blood in the stool.  Last time he noticed any blood was a week ago.  He is able to distinguish this from bleeding from pilonidal cyst drainage.  He has alternating bowel movements, can have loose stools or go a couple days without a bowel movement.  He has recently been making dietary changes, trying to lose weight, and this has helped some in regulating his bowel movements. Still having intermittent loose stools.  Has a bowel movement on average every 1 to 2 days.  He denies any abdominal or rectal pain.  Thinks he may have noticed a hemorrhoid in the past.  States he felt something protrude from his anus during a bowel movement, but he was able to reduce it.  He denies nausea, vomiting, acid reflux, heartburn, dysphagia.  No prior colonoscopy.  Patient states his uncle had colon cancer which was diagnosed in his early 41s.  He denies any other family history of colon cancer or colon polyps.  Denies family history of IBD.  Aside from pilonidal cyst, he denies any ongoing medical problems and is not on any medications.  Denies any problems with anesthesia during previous surgeries.   Previous GI Procedures/Imaging   None  Past Medical History:  Diagnosis Date   COVID 09/2020   sob  cough fatigue loss of taste/wmell x 3 weeks all symptoms resolved   Elevated blood pressure reading 04/10/2021   Pilonidal cyst 07/2020   drains occ with bleeding uses soap and water    Past Surgical History:  Procedure Laterality Date   NO PAST SURGERIES     PILONIDAL CYST EXCISION N/A 04/13/2021   Procedure: PILONIDAL CYST EXCISION;  Surgeon: Lujean Sake, MD;  Location: [facility];  Service: General;  Laterality: N/A;   PILONIDAL CYST EXCISION N/A 12/20/2022   Procedure: CYST EXCISION PILONIDAL EXTENSIVE;  Surgeon: Conrado Delay, DO;  Location: [facility];  Service: General;  Laterality: N/A;    No current outpatient medications on file.   No current facility-administered medications for this visit.    Allergies as of 04/09/2024   (No Known Allergies)    Family History  Problem Relation Age of Onset   Hypertension Mother    Hyperlipidemia Father    Hypertension Father    Cancer Maternal Uncle        ? pancreatic cancer   Heart failure Maternal Grandmother     Social History   Tobacco Use   Smoking status: Former    Current packs/day: 0.00    Average packs/day: 0.5 packs/day for 2.0 years (1.0 ttl pk-yrs)    Types: Cigarettes    Start date: 04/10/2009    Quit date: 04/11/2011    Years since quitting: 13.0   Smokeless tobacco: Never  Vaping Use   Vaping status: Never Used  Substance Use  Topics   Alcohol use: Yes    Alcohol/week: 3.0 standard drinks of alcohol    Types: 1 Glasses of wine, 1 Cans of beer, 1 Shots of liquor per week    Comment: occ   Drug use: Never     Review of Systems:    Constitutional: No unintentional weight loss, fever, chills, weakness or fatigue Skin: No rash or itching Cardiovascular: No chest pain, chest pressure or palpitations   Respiratory: No SOB or cough Gastrointestinal: See HPI and otherwise negative Genitourinary: No dysuria or change in urinary frequency Neurological: No headache, dizziness or  syncope Musculoskeletal: No new muscle or joint pain Hematologic: No bruising    Physical Exam:  Vital signs: BP 130/88   Pulse (!) 112   Ht 5' 7 (1.702 m)   Wt 289 lb (131.1 kg)   BMI 45.26 kg/m    Constitutional: Pleasant, obese male in no acute distress.  Alert and cooperative Head:  Normocephalic and atraumatic.  Eyes: No scleral icterus. Conjunctiva pink. Mouth: No oral lesions. Respiratory: Respirations even and unlabored. Lungs clear to auscultation bilaterally.  No wheezes, crackles, or rhonchi.  Cardiovascular:  Regular rate and rhythm. No murmurs. No peripheral edema. Gastrointestinal:  Soft, nondistended, nontender. No rebound or guarding. Normal bowel sounds. No appreciable masses or hepatomegaly. Rectal: Deferred to colonoscopy. Neurologic:  Alert and oriented x4;  grossly normal neurologically.  Skin:   Dry and intact without significant lesions or rashes. Psychiatric: Oriented to person, place and time. Demonstrates good judgement and reason without abnormal affect or behaviors.   RELEVANT LABS AND IMAGING: CBC    Component Value Date/Time   WBC 7.1 01/22/2023 1203   RBC 5.22 01/22/2023 1203   HGB 15.6 01/22/2023 1203   HCT 45.5 01/22/2023 1203   PLT 243.0 01/22/2023 1203   MCV 87.0 01/22/2023 1203   MCHC 34.3 01/22/2023 1203   RDW 13.8 01/22/2023 1203   LYMPHSABS 3.1 01/22/2023 1203   MONOABS 0.5 01/22/2023 1203   EOSABS 0.2 01/22/2023 1203   BASOSABS 0.0 01/22/2023 1203    CMP     Component Value Date/Time   NA 136 01/22/2023 1203   K 3.6 01/22/2023 1203   CL 100 01/22/2023 1203   CO2 29 01/22/2023 1203   GLUCOSE 88 01/22/2023 1203   BUN 18 01/22/2023 1203   CREATININE 1.04 01/22/2023 1203   CALCIUM 10.0 01/22/2023 1203   PROT 7.8 01/22/2023 1203   ALBUMIN 4.6 01/22/2023 1203   AST 26 01/22/2023 1203   ALT 35 01/22/2023 1203   ALKPHOS 56 01/22/2023 1203   BILITOT 0.7 01/22/2023 1203     Assessment/Plan:   Change in bowel  habits Rectal bleeding Family history of colon cancer - Uncle, age early 33s Patient has been noticing intermittent rectal bleeding for the last 3 months.  Has history of pilonidal cysts s/p excision 2022 and 2024.  He is able to distinguish rectal bleeding from pilonidal cyst drainage. Has had some irregular bowel habits and intermittent diarrhea.  Some improvement with dietary and lifestyle changes. He has family history of colon cancer in his uncle, who was diagnosed in his early 62s. No anemia on recent labs. Bleeding possibly due to hemorrhoids, and states he noted something protruding from the anus a while ago which was reducible after a bowel movement.  Will schedule colonoscopy to rule out underlying malignancy.  - Schedule colonoscopy. I thoroughly discussed the procedure with the patient to include nature of the procedure, alternatives, benefits, and risks (  including but not limited to bleeding, infection, perforation, anesthesia/cardiac/pulmonary complications). Patient verbalized understanding and gave verbal consent to proceed with procedure.  - Repeat CBC   [name] Gaul, PA-C [facility] 04/09/2024, 8:20 AM  Patient Care Team: Colene Dauphin, MD as PCP - General (Internal Medicine)

## 2024-04-21 DIAGNOSIS — D126 Benign neoplasm of colon, unspecified: Secondary | ICD-10-CM

## 2024-04-21 HISTORY — PX: COLONOSCOPY: SHX174

## 2024-04-21 HISTORY — DX: Benign neoplasm of colon, unspecified: D12.6

## 2024-05-07 ENCOUNTER — Encounter: Payer: Self-pay | Admitting: Internal Medicine

## 2024-05-13 ENCOUNTER — Ambulatory Visit: Admitting: Internal Medicine

## 2024-05-13 ENCOUNTER — Encounter: Payer: Self-pay | Admitting: Internal Medicine

## 2024-05-13 VITALS — BP 136/79 | HR 66 | Temp 98.4°F | Resp 14 | Ht 67.0 in | Wt 289.0 lb

## 2024-05-13 DIAGNOSIS — K648 Other hemorrhoids: Secondary | ICD-10-CM

## 2024-05-13 DIAGNOSIS — K625 Hemorrhage of anus and rectum: Secondary | ICD-10-CM | POA: Diagnosis not present

## 2024-05-13 DIAGNOSIS — D124 Benign neoplasm of descending colon: Secondary | ICD-10-CM

## 2024-05-13 DIAGNOSIS — L0591 Pilonidal cyst without abscess: Secondary | ICD-10-CM | POA: Diagnosis not present

## 2024-05-13 MED ORDER — SODIUM CHLORIDE 0.9 % IV SOLN
500.0000 mL | Freq: Once | INTRAVENOUS | Status: DC
Start: 2024-05-13 — End: 2024-05-13

## 2024-05-13 NOTE — Progress Notes (Signed)
 [facility] History and Physical   Primary Care Physician:  Geofm Glade PARAS, MD   Reason for Procedure:    Encounter Diagnosis  Name Primary?   Rectal bleeding Yes     Plan:    colonoscopy     HPI: [name] is a 38 y.o. male seen in the clinic 04/09/24 as below, here for evaluation of rectal bleeding   6/19 HPI [name] is a 38 y.o. male with past medical history of pilonidal cyst (s/p excision 2022, 2024), obesity who presents today for a complaint of rectal bleeding.   At visit with PCP 01/2023, patient noted to have predominantly loose stools, sometimes notices blood when he wipes.   Labs 01/22/2023: Normal CBC, hemoglobin A1c 5.5, TSH 0.87, elevated cholesterol/triglycerides/LDL, normal CMP     Patient states he has been seeing blood predominantly on the toilet paper after bowel movements intermittently for the last 3 months.  He is unsure if there has been any blood in the stool.  Last time he noticed any blood was a week ago.  He is able to distinguish this from bleeding from pilonidal cyst drainage.   He has alternating bowel movements, can have loose stools or go a couple days without a bowel movement.  He has recently been making dietary changes, trying to lose weight, and this has helped some in regulating his bowel movements. Still having intermittent loose stools.  Has a bowel movement on average every 1 to 2 days.   He denies any abdominal or rectal pain.  Thinks he may have noticed a hemorrhoid in the past.  States he felt something protrude from his anus during a bowel movement, but he was able to reduce it.   He denies nausea, vomiting, acid reflux, heartburn, dysphagia.   No prior colonoscopy.   Patient states his uncle had colon cancer which was diagnosed in his early 15s.  He denies any other family history of colon cancer or colon polyps.  Denies family history of IBD.   Aside from pilonidal cyst, he denies any ongoing medical problems  and is not on any medications.  Denies any problems with anesthesia during previous surgeries.   Past Medical History:  Diagnosis Date   COVID 09/2020   sob cough fatigue loss of taste/wmell x 3 weeks all symptoms resolved   Elevated blood pressure reading 04/10/2021   Pilonidal cyst 07/2020   drains occ with bleeding uses soap and water    Past Surgical History:  Procedure Laterality Date   PILONIDAL CYST EXCISION N/A 04/13/2021   Procedure: PILONIDAL CYST EXCISION;  Surgeon: Dasie Leonor CROME, MD;  Location: [facility];  Service: General;  Laterality: N/A;   PILONIDAL CYST EXCISION N/A 12/20/2022   Procedure: CYST EXCISION PILONIDAL EXTENSIVE;  Surgeon: Tye Millet, DO;  Location: [facility];  Service: General;  Laterality: N/A;    Prior to Admission medications   Not on File    No current outpatient medications on file.   Current Facility-Administered Medications  Medication Dose Route Frequency Provider Last Rate Last Admin   0.9 %  sodium chloride  infusion  500 mL Intravenous Once Avram Lupita BRAVO, MD        Allergies as of 05/13/2024   (No Known Allergies)    Family History  Problem Relation Age of Onset   Hypertension Mother    Hyperlipidemia Father    Hypertension Father    Colon cancer Maternal Uncle        mid  40s to 50   Cancer Maternal Uncle        ? pancreatic cancer   Heart failure Maternal Grandmother    Liver cancer Neg Hx    Esophageal cancer Neg Hx    Stomach cancer Neg Hx     Social History   Socioeconomic History   Marital status: Married    Spouse name: Not on file   Number of children: 1   Years of education: Not on file   Highest education level: Not on file  Occupational History   Not on file  Tobacco Use   Smoking status: Former    Current packs/day: 0.00    Average packs/day: 0.5 packs/day for 2.0 years (1.0 ttl pk-yrs)    Types: Cigarettes    Start date: 04/10/2009    Quit date: 04/11/2011    Years since quitting:  13.0   Smokeless tobacco: Never  Vaping Use   Vaping status: Never Used  Substance and Sexual Activity   Alcohol use: Yes    Alcohol/week: 3.0 standard drinks of alcohol    Types: 1 Glasses of wine, 1 Cans of beer, 1 Shots of liquor per week    Comment: occ   Drug use: Never   Sexual activity: Yes  Other Topics Concern   Not on file  Social History Narrative   Not on file   Social Drivers of Health   Financial Resource Strain: Not on file  Food Insecurity: Not on file  Transportation Needs: Not on file  Physical Activity: Not on file  Stress: Not on file  Social Connections: Not on file  Intimate Partner Violence: Not on file    Review of Systems:  All other review of systems negative except as mentioned in the HPI.  Physical Exam: Vital signs BP 139/86   Pulse 68   Temp 98.4 F (36.9 C)   Ht 5' 7 (1.702 m)   Wt 289 lb (131.1 kg)   SpO2 98%   BMI 45.26 kg/m   General:   Alert,  Well-developed, well-nourished, pleasant and cooperative in NAD Lungs:  Clear throughout to auscultation.   Heart:  Regular rate and rhythm; no murmurs, clicks, rubs,  or gallops. Abdomen:  Soft, nontender and nondistended. Normal bowel sounds.   Neuro/Psych:  Alert and cooperative. Normal mood and affect. A and O x 3   @Raya Mckinstry  CHARLENA Commander, MD, [facility] [phone] (pager) 05/13/2024 10:24 AM@

## 2024-05-13 NOTE — Patient Instructions (Addendum)
 I saw internal hemorrhoids and think that is where blood came from.  There was a tiny polyp found and removed also.  All else normal.  I will let you know pathology results and when to have another routine colonoscopy by mail and/or My Chart.  I appreciate the opportunity to care for you. [name] CHARLENA Commander, MD, FACG   YOU HAD AN ENDOSCOPIC PROCEDURE TODAY AT THE [facility]:   Refer to the procedure report that was given to you for any specific questions about what was found during the examination.  If the procedure report does not answer your questions, please call your gastroenterologist to clarify.  If you requested that your care partner not be given the details of your procedure findings, then the procedure report has been included in a sealed envelope for you to review at your convenience later.  YOU SHOULD EXPECT: Some feelings of bloating in the abdomen. Passage of more gas than usual.  Walking can help get rid of the air that was put into your GI tract during the procedure and reduce the bloating. If you had a lower endoscopy (such as a colonoscopy or flexible sigmoidoscopy) you may notice spotting of blood in your stool or on the toilet paper. If you underwent a bowel prep for your procedure, you may not have a normal bowel movement for a few days.  Please Note:  You might notice some irritation and congestion in your nose or some drainage.  This is from the oxygen used during your procedure.  There is no need for concern and it should clear up in a day or so.  SYMPTOMS TO REPORT IMMEDIATELY:  Following lower endoscopy (colonoscopy or flexible sigmoidoscopy):  Excessive amounts of blood in the stool  Significant tenderness or worsening of abdominal pains  Swelling of the abdomen that is new, acute  Fever of 100F or higher  For urgent or emergent issues, a gastroenterologist can be reached at any hour by calling [phone]. Do not use MyChart messaging for urgent  concerns.    DIET:  We do recommend a small meal at first, but then you may proceed to your regular diet.  Drink plenty of fluids but you should avoid alcoholic beverages for 24 hours.  ACTIVITY:  You should plan to take it easy for the rest of today and you should NOT DRIVE or use heavy machinery until tomorrow (because of the sedation medicines used during the test).    FOLLOW UP: Our staff will call the number listed on your records the next business day following your procedure.  We will call around 7:15- 8:00 am to check on you and address any questions or concerns that you may have regarding the information given to you following your procedure. If we do not reach you, we will leave a message.     If any biopsies were taken you will be contacted by phone or by letter within the next 1-3 weeks.  Please call us  at [phone] if you have not heard about the biopsies in 3 weeks.    SIGNATURES/CONFIDENTIALITY: You and/or your care partner have signed paperwork which will be entered into your electronic medical record.  These signatures attest to the fact that that the information above on your After Visit Summary has been reviewed and is understood.  Full responsibility of the confidentiality of this discharge information lies with you and/or your care-partner.

## 2024-05-13 NOTE — Progress Notes (Signed)
 Called to room to assist during endoscopic procedure.  Patient ID and intended procedure confirmed with present staff. Received instructions for my participation in the procedure from the performing physician.

## 2024-05-13 NOTE — Op Note (Signed)
  [facility] Patient Name: [name] Procedure Date: 05/13/2024 10:26 AM MRN: [id number] Endoscopist: Lupita FORBES Commander , MD, [id number] Age: 38 Referring MD:  Date of Birth: 1986-02-15 Gender: Male Account #: [id number] Procedure:                Colonoscopy Indications:              Rectal bleeding Medicines:                Monitored Anesthesia Care Procedure:                Pre-Anesthesia Assessment:                           - Prior to the procedure, a History and Physical                            was performed, and patient medications and                            allergies were reviewed. The patient's tolerance of                            previous anesthesia was also reviewed. The risks                            and benefits of the procedure and the sedation                            options and risks were discussed with the patient.                            All questions were answered, and informed consent                            was obtained. Prior Anticoagulants: The patient has                            taken no anticoagulant or antiplatelet agents. ASA                            Grade Assessment: II - A patient with mild systemic                            disease. After reviewing the risks and benefits,                            the patient was deemed in satisfactory condition to                            undergo the procedure.                           After obtaining informed consent, the colonoscope  was passed under direct vision. Throughout the                            procedure, the patient's blood pressure, pulse, and                            oxygen saturations were monitored continuously. The                            Olympus Scope SN [id number] was introduced through the                            anus and advanced to the the terminal ileum, with                            identification of the appendiceal  orifice and IC                            valve. The colonoscopy was performed without                            difficulty. The patient tolerated the procedure                            well. The quality of the bowel preparation was                            good. The terminal ileum, ileocecal valve,                            appendiceal orifice, and rectum were photographed.                            The bowel preparation used was SUPREP via split                            dose instruction. Scope In: 10:35:50 AM Scope Out: 10:52:30 AM Scope Withdrawal Time: 0 hours 13 minutes 10 seconds  Total Procedure Duration: 0 hours 16 minutes 40 seconds  Findings:                 The perianal exam findings include pilonidal cyst                            wound.                           The digital rectal exam was normal.                           A 4 mm polyp was found in the descending colon. The                            polyp was sessile. The polyp was removed with a  cold snare. Resection and retrieval were complete.                            Verification of patient identification for the                            specimen was done. Estimated blood loss was minimal.                           Internal hemorrhoids were found.                           The exam was otherwise without abnormality on                            direct and retroflexion views. Complications:            No immediate complications. Estimated Blood Loss:     Estimated blood loss was minimal. Impression:               - Pilonidal cyst wound found on perianal exam.                           - One 4 mm polyp in the descending colon, removed                            with a cold snare. Resected and retrieved.                           - Internal hemorrhoids.                           - The examination was otherwise normal on direct                            and retroflexion  views. Recommendation:           - Patient has a contact number available for                            emergencies. The signs and symptoms of potential                            delayed complications were discussed with the                            patient. Return to normal activities tomorrow.                            Written discharge instructions were provided to the                            patient.                           - Resume previous diet.                           -  Continue present medications.                           - Repeat colonoscopy is recommended. The                            colonoscopy date will be determined after pathology                            results from today's exam become available for                            review. Lupita FORBES Commander, MD 05/13/2024 10:58:59 AM This report has been signed electronically.

## 2024-05-13 NOTE — Progress Notes (Signed)
 Sedate, gd SR, tolerated procedure well, VSS, report to RN

## 2024-05-14 ENCOUNTER — Telehealth: Payer: Self-pay | Admitting: *Deleted

## 2024-05-14 NOTE — Telephone Encounter (Signed)
 Attempted post procedure follow up call.  No answer - LVM.

## 2024-05-15 LAB — SURGICAL PATHOLOGY

## 2024-05-21 ENCOUNTER — Encounter: Payer: Self-pay | Admitting: Internal Medicine

## 2024-05-21 ENCOUNTER — Ambulatory Visit: Payer: Self-pay | Admitting: Internal Medicine

## 2024-07-16 DIAGNOSIS — J209 Acute bronchitis, unspecified: Secondary | ICD-10-CM | POA: Diagnosis not present

## 2024-07-21 ENCOUNTER — Encounter: Payer: Self-pay | Admitting: Internal Medicine

## 2024-07-22 MED ORDER — HYDROCOD POLI-CHLORPHE POLI ER 10-8 MG/5ML PO SUER: 5.0000 mL | Freq: Two times a day (BID) | ORAL | 0 refills | Status: DC | PRN

## 2024-07-22 MED ORDER — ALBUTEROL SULFATE HFA 108 (90 BASE) MCG/ACT IN AERS: 2.0000 | INHALATION_SPRAY | Freq: Four times a day (QID) | RESPIRATORY_TRACT | 0 refills | Status: AC | PRN

## 2024-09-02 ENCOUNTER — Encounter: Payer: Self-pay | Admitting: Internal Medicine

## 2024-09-04 ENCOUNTER — Ambulatory Visit

## 2024-09-04 ENCOUNTER — Encounter: Payer: Self-pay | Admitting: Internal Medicine

## 2024-09-04 ENCOUNTER — Ambulatory Visit: Admitting: Internal Medicine

## 2024-09-04 VITALS — BP 132/80 | HR 65 | Temp 98.1°F | Ht 67.0 in | Wt 278.0 lb

## 2024-09-04 DIAGNOSIS — R053 Chronic cough: Secondary | ICD-10-CM

## 2024-09-04 DIAGNOSIS — R911 Solitary pulmonary nodule: Secondary | ICD-10-CM | POA: Diagnosis not present

## 2024-09-04 DIAGNOSIS — R059 Cough, unspecified: Secondary | ICD-10-CM | POA: Insufficient documentation

## 2024-09-04 MED ORDER — FAMOTIDINE 40 MG PO TABS
40.0000 mg | ORAL_TABLET | Freq: Every day | ORAL | 2 refills | Status: DC
Start: 2024-09-04 — End: 2024-09-15

## 2024-09-04 NOTE — Progress Notes (Signed)
 Subjective:    Patient ID: [name], male    DOB: [birth year], 38 y.o.   MRN: [id number]      HPI Akin is here for  Chief Complaint  Patient presents with   Cough    Cough for a few months   Discussed the use of AI scribe software for clinical note transcription with the patient, who gave verbal consent to proceed.  History of Present Illness [name] is a 38 year old male who presents with a persistent cough since August.  He has been experiencing a persistent dry cough since August, which he believes began after contracting COVID-19 during a trip. The cough is sporadic, without a specific pattern or trigger, and sometimes feels as though it originates deep within the lungs. Occasionally, he produces mucus, but it is not consistent or bothersome.  No associated symptoms such as shortness of breath, wheezing, chest tightness, fever, nasal congestion, runny nose, ear pain, sore throat, sinus pain, headaches, lightheadedness, chest pain, palpitations, nausea, heartburn, reflux, or trouble swallowing. He drinks a lot of water throughout the day and has not noticed any hoarseness.  He mentions a previous diagnosis of bronchitis following COVID-19, but no chest x-ray was performed at that time. He has used cough medicine, which alleviates the symptoms, but he is reluctant to use it long-term. He does not have a history of seasonal allergies and has not noticed any new environmental changes at home that could contribute to his symptoms.  The cough is particularly annoying at work and when trying to sleep.      Medications and allergies reviewed with patient and updated if appropriate.  Current Outpatient Medications on File Prior to Visit  Medication Sig Dispense Refill   albuterol (VENTOLIN HFA) 108 (90 Base) MCG/ACT inhaler Inhale 2 puffs into the lungs every 6 (six) hours as needed for wheezing or shortness of breath. 8 g 0   chlorpheniramine-HYDROcodone  (TUSSIONEX)  10-8 MG/5ML Take 5 mLs by mouth every 12 (twelve) hours as needed. 115 mL 0   No current facility-administered medications on file prior to visit.    Review of Systems  Constitutional:  Negative for fever.  HENT:  Positive for postnasal drip (a little). Negative for congestion, ear pain, rhinorrhea, sinus pressure, sore throat, trouble swallowing and voice change.   Respiratory:  Positive for cough (dry). Negative for chest tightness, shortness of breath and wheezing.   Cardiovascular:  Negative for chest pain and palpitations.  Gastrointestinal:  Negative for nausea.       No gerd  Neurological:  Negative for light-headedness and headaches.       Objective:   Vitals:   09/04/24 1035  BP: 132/80  Pulse: 65  Temp: 98.1 F (36.7 C)  SpO2: 95%   BP Readings from Last 3 Encounters:  09/04/24 132/80  05/13/24 136/79  04/09/24 130/88   Wt Readings from Last 3 Encounters:  09/04/24 278 lb (126.1 kg)  05/13/24 289 lb (131.1 kg)  04/09/24 289 lb (131.1 kg)   Body mass index is 43.54 kg/m.    Physical Exam Constitutional:      General: He is not in acute distress.    Appearance: Normal appearance. He is not ill-appearing.  HENT:     Head: Normocephalic and atraumatic.     Right Ear: Tympanic membrane, ear canal and external ear normal. There is no impacted cerumen.     Left Ear: Tympanic membrane, ear canal and external ear normal. There is no  impacted cerumen.     Mouth/Throat:     Mouth: Mucous membranes are moist.     Pharynx: No oropharyngeal exudate or posterior oropharyngeal erythema.  Eyes:     Conjunctiva/sclera: Conjunctivae normal.  Cardiovascular:     Rate and Rhythm: Normal rate and regular rhythm.     Heart sounds: Normal heart sounds.  Pulmonary:     Effort: Pulmonary effort is normal. No respiratory distress.     Breath sounds: Normal breath sounds. No wheezing or rales.  Musculoskeletal:     Cervical back: Neck supple. No tenderness.     Right lower  leg: No edema.     Left lower leg: No edema.  Lymphadenopathy:     Cervical: No cervical adenopathy.  Skin:    General: Skin is warm and dry.     Findings: No rash.  Neurological:     Mental Status: He is alert. Mental status is at baseline.  Psychiatric:        Mood and Affect: Mood normal.            Assessment & Plan:    See Problem List for Assessment and Plan of chronic medical problems.   Assessment and Plan Assessment & Plan Chronic cough Likely post-viral following suspected COVID-19. Differential includes post-viral cough, allergies, silent reflux. No infection or medication-induced cough. Lungs clear. - Ordered chest x-ray to rule out lung pathology. - Referred to ENT for upper airway evaluation. - Prescribed stronger Pepcid  for one month for reflux assessment. - Advised to monitor symptoms and report if no improvement.

## 2024-09-04 NOTE — Patient Instructions (Addendum)
      A chest xray was ordered.       Medications changes include :   pepcid  40 mg daily    A referral was ordered ENT and someone will call you to schedule an appointment.     Return if symptoms worsen or fail to improve.

## 2024-09-04 NOTE — Assessment & Plan Note (Signed)
 Chronic cough-persistent since August Had COVID and then developed bronchitis and has had a cough since then ?  Cough getting better or not ?  Residual from infections versus PND/allergies versus silent GERD Will get a chest x-ray today Start Pepcid  40 mg daily for 3-4 weeks to see if cough improves-this could help with diagnosis is much as treatment Referral to ENT

## 2024-09-05 ENCOUNTER — Ambulatory Visit: Payer: Self-pay | Admitting: Internal Medicine

## 2024-09-05 DIAGNOSIS — R9389 Abnormal findings on diagnostic imaging of other specified body structures: Secondary | ICD-10-CM

## 2024-09-07 ENCOUNTER — Ambulatory Visit (INDEPENDENT_AMBULATORY_CARE_PROVIDER_SITE_OTHER)

## 2024-09-07 ENCOUNTER — Telehealth: Payer: Self-pay

## 2024-09-07 ENCOUNTER — Ambulatory Visit: Payer: Self-pay | Admitting: Internal Medicine

## 2024-09-07 DIAGNOSIS — R918 Other nonspecific abnormal finding of lung field: Secondary | ICD-10-CM | POA: Diagnosis not present

## 2024-09-07 DIAGNOSIS — R9389 Abnormal findings on diagnostic imaging of other specified body structures: Secondary | ICD-10-CM | POA: Diagnosis not present

## 2024-09-07 NOTE — Telephone Encounter (Signed)
 This is regarding your chest x-ray from last week.  Chest x-ray today is normal.

## 2024-09-07 NOTE — Telephone Encounter (Signed)
 Copied from CRM #[id number]. Topic: Clinical - Lab/Test Results >> Sep 07, 2024  3:33 PM Suzen RAMAN wrote: Reason for CRM: per [facility] Radiology called to report nodule present in right lower lung; would like provider to consider nipple marker chest x-ray

## 2024-09-14 ENCOUNTER — Encounter: Payer: Self-pay | Admitting: Internal Medicine

## 2024-09-14 NOTE — Progress Notes (Unsigned)
 Subjective:    Patient ID: [name], male    DOB: 1986/03/28, 38 y.o.   MRN: [id number]     HPI [name] is here for a physical exam and his chronic medical problems.  Cough better?     Medications and allergies reviewed with patient and updated if appropriate.  Current Outpatient Medications on File Prior to Visit  Medication Sig Dispense Refill   albuterol  (VENTOLIN  HFA) 108 (90 Base) MCG/ACT inhaler Inhale 2 puffs into the lungs every 6 (six) hours as needed for wheezing or shortness of breath. 8 g 0   famotidine  (PEPCID ) 40 MG tablet Take 1 tablet (40 mg total) by mouth daily. 30 tablet 2   No current facility-administered medications on file prior to visit.    Review of Systems  Constitutional:  Negative for fever.  Eyes:  Negative for visual disturbance.  Respiratory:  Negative for cough, shortness of breath and wheezing.   Cardiovascular:  Negative for chest pain, palpitations and leg swelling.  Gastrointestinal:  Negative for abdominal pain, blood in stool, constipation and diarrhea.       No gerd  Genitourinary:  Negative for difficulty urinating, dysuria and hematuria.  Musculoskeletal:  Negative for arthralgias and back pain.  Skin:  Negative for rash.  Neurological:  Negative for light-headedness and headaches.  Psychiatric/Behavioral:  Negative for dysphoric mood. The patient is not nervous/anxious.        Objective:  There were no vitals filed for this visit. There were no vitals filed for this visit. There is no height or weight on file to calculate BMI.  BP Readings from Last 3 Encounters:  09/04/24 132/80  05/13/24 136/79  04/09/24 130/88    Wt Readings from Last 3 Encounters:  09/04/24 278 lb (126.1 kg)  05/13/24 289 lb (131.1 kg)  04/09/24 289 lb (131.1 kg)      Physical Exam Constitutional: He appears well-developed and well-nourished. No distress.  HENT:  Head: Normocephalic and atraumatic.  Right Ear: External ear normal.   Left Ear: External ear normal.  Normal ear canals and TM b/l  Mouth/Throat: Oropharynx is clear and moist. Eyes: Conjunctivae and EOM are normal.  Neck: Neck supple. No tracheal deviation present. No thyromegaly present.  No carotid bruit  Cardiovascular: Normal rate, regular rhythm, normal heart sounds and intact distal pulses.   No murmur heard.  No lower extremity edema. Pulmonary/Chest: Effort normal and breath sounds normal. No respiratory distress. He has no wheezes. He has no rales.  Abdominal: Soft. He exhibits no distension. There is no tenderness.  Genitourinary: deferred  Lymphadenopathy:   He has no cervical adenopathy.  Skin: Skin is warm and dry. He is not diaphoretic.  Psychiatric: He has a normal mood and affect. His behavior is normal.         Assessment & Plan:   Physical exam: Screening blood work  ordered Exercise    Weight  obese- encouraged weight loss Substance abuse   none   Reviewed recommended immunizations.   Health Maintenance  Topic Date Due   Hepatitis B Vaccines 19-59 Average Risk (1 of 3 - 19+ 3-dose series) Never done   HPV VACCINES (1 - 3-dose SCDM series) Never done   Influenza Vaccine  05/22/2024   COVID-19 Vaccine (4 - 2025-26 season) 06/22/2024   HIV Screening  01/08/2049 (Originally 11/17/2001)   Colonoscopy  05/14/2031   DTaP/Tdap/Td (2 - Td or Tdap) 01/21/2033   Hepatitis C Screening  Completed   Pneumococcal Vaccine  Aged Out   Meningococcal B Vaccine  Aged Out     See Problem List for Assessment and Plan of chronic medical problems.

## 2024-09-14 NOTE — Patient Instructions (Addendum)
 HPV vaccine #1 given.   Blood work was ordered.       Medications changes include :   None     Return in about 1 year (around 09/15/2025) for Physical Exam, schedule HPV #2 in 1-2 months, HPV #3 in 6 months.    Health Maintenance, Male Adopting a healthy lifestyle and getting preventive care are important in promoting health and wellness. Ask your health care provider about: The right schedule for you to have regular tests and exams. Things you can do on your own to prevent diseases and keep yourself healthy. What should I know about diet, weight, and exercise? Eat a healthy diet  Eat a diet that includes plenty of vegetables, fruits, low-fat dairy products, and lean protein. Do not eat a lot of foods that are high in solid fats, added sugars, or sodium. Maintain a healthy weight Body mass index (BMI) is a measurement that can be used to identify possible weight problems. It estimates body fat based on height and weight. Your health care provider can help determine your BMI and help you achieve or maintain a healthy weight. Get regular exercise Get regular exercise. This is one of the most important things you can do for your health. Most adults should: Exercise for at least 150 minutes each week. The exercise should increase your heart rate and make you sweat (moderate-intensity exercise). Do strengthening exercises at least twice a week. This is in addition to the moderate-intensity exercise. Spend less time sitting. Even light physical activity can be beneficial. Watch cholesterol and blood lipids Have your blood tested for lipids and cholesterol at 37 years of age, then have this test every 5 years. You may need to have your cholesterol levels checked more often if: Your lipid or cholesterol levels are high. You are older than 37 years of age. You are at high risk for heart disease. What should I know about cancer screening? Many types of cancers can be detected early  and may often be prevented. Depending on your health history and family history, you may need to have cancer screening at various ages. This may include screening for: Colorectal cancer. Prostate cancer. Skin cancer. Lung cancer. What should I know about heart disease, diabetes, and high blood pressure? Blood pressure and heart disease High blood pressure causes heart disease and increases the risk of stroke. This is more likely to develop in people who have high blood pressure readings or are overweight. Talk with your health care provider about your target blood pressure readings. Have your blood pressure checked: Every 3-5 years if you are 55-61 years of age. Every year if you are 56 years old or older. If you are between the ages of 63 and 51 and are a current or former smoker, ask your health care provider if you should have a one-time screening for abdominal aortic aneurysm (AAA). Diabetes Have regular diabetes screenings. This checks your fasting blood sugar level. Have the screening done: Once every three years after age 70 if you are at a normal weight and have a low risk for diabetes. More often and at a younger age if you are overweight or have a high risk for diabetes. What should I know about preventing infection? Hepatitis B If you have a higher risk for hepatitis B, you should be screened for this virus. Talk with your health care provider to find out if you are at risk for hepatitis B infection. Hepatitis C Blood testing is recommended for:  Everyone born from [birth year] through 1964. Anyone with known risk factors for hepatitis C. Sexually transmitted infections (STIs) You should be screened each year for STIs, including gonorrhea and chlamydia, if: You are sexually active and are younger than 37 years of age. You are older than 37 years of age and your health care provider tells you that you are at risk for this type of infection. Your sexual activity has changed since you were  last screened, and you are at increased risk for chlamydia or gonorrhea. Ask your health care provider if you are at risk. Ask your health care provider about whether you are at high risk for HIV. Your health care provider may recommend a prescription medicine to help prevent HIV infection. If you choose to take medicine to prevent HIV, you should first get tested for HIV. You should then be tested every 3 months for as long as you are taking the medicine. Follow these instructions at home: Alcohol use Do not drink alcohol if your health care provider tells you not to drink. If you drink alcohol: Limit how much you have to 0-2 drinks a day. Know how much alcohol is in your drink. In the [place] one drink equals one 12 oz bottle of beer (355 mL), one 5 oz glass of wine (148 mL), or one 1 oz glass of hard liquor (44 mL). Lifestyle Do not use any products that contain nicotine or tobacco. These products include cigarettes, chewing tobacco, and vaping devices, such as e-cigarettes. If you need help quitting, ask your health care provider. Do not use street drugs. Do not share needles. Ask your health care provider for help if you need support or information about quitting drugs. General instructions Schedule regular health, dental, and eye exams. Stay current with your vaccines. Tell your health care provider if: You often feel depressed. You have ever been abused or do not feel safe at home. Summary Adopting a healthy lifestyle and getting preventive care are important in promoting health and wellness. Follow your health care provider's instructions about healthy diet, exercising, and getting tested or screened for diseases. Follow your health care provider's instructions on monitoring your cholesterol and blood pressure. This information is not intended to replace advice given to you by your health care provider. Make sure you discuss any questions you have with your health care  provider. Document Revised: 02/27/2021 Document Reviewed: 02/27/2021 Elsevier Patient Education  2024 [employer].

## 2024-09-15 ENCOUNTER — Ambulatory Visit: Payer: Self-pay | Admitting: Internal Medicine

## 2024-09-15 ENCOUNTER — Ambulatory Visit: Admitting: Internal Medicine

## 2024-09-15 VITALS — BP 122/80 | HR 74 | Temp 98.3°F | Ht 67.0 in | Wt 278.0 lb

## 2024-09-15 DIAGNOSIS — R739 Hyperglycemia, unspecified: Secondary | ICD-10-CM

## 2024-09-15 DIAGNOSIS — Z23 Encounter for immunization: Secondary | ICD-10-CM

## 2024-09-15 DIAGNOSIS — E782 Mixed hyperlipidemia: Secondary | ICD-10-CM | POA: Diagnosis not present

## 2024-09-15 DIAGNOSIS — Z0000 Encounter for general adult medical examination without abnormal findings: Secondary | ICD-10-CM | POA: Diagnosis not present

## 2024-09-15 DIAGNOSIS — R053 Chronic cough: Secondary | ICD-10-CM

## 2024-09-15 LAB — COMPREHENSIVE METABOLIC PANEL WITH GFR
ALT: 47 U/L (ref 0–53)
AST: 28 U/L (ref 0–37)
Albumin: 4.5 g/dL (ref 3.5–5.2)
Alkaline Phosphatase: 60 U/L (ref 39–117)
BUN: 13 mg/dL (ref 6–23)
CO2: 29 meq/L (ref 19–32)
Calcium: 9.9 mg/dL (ref 8.4–10.5)
Chloride: 101 meq/L (ref 96–112)
Creatinine, Ser: 1.06 mg/dL (ref 0.40–1.50)
GFR: 89.46 mL/min (ref >60.00)
Glucose, Bld: 93 mg/dL (ref 70–99)
Potassium: 3.6 meq/L (ref 3.5–5.1)
Sodium: 139 meq/L (ref 135–145)
Total Bilirubin: 0.5 mg/dL (ref 0.2–1.2)
Total Protein: 7.6 g/dL (ref 6.0–8.3)

## 2024-09-15 LAB — HEMOGLOBIN A1C: Hgb A1c MFr Bld: 5.4 % (ref 4.6–6.5)

## 2024-09-15 LAB — LIPID PANEL
Cholesterol: 220 mg/dL — ABNORMAL HIGH (ref 0–200)
HDL: 52.7 mg/dL (ref >39.00)
LDL Cholesterol: 138 mg/dL — ABNORMAL HIGH (ref 0–99)
NonHDL: 166.96
Total CHOL/HDL Ratio: 4
Triglycerides: 147 mg/dL (ref 0.0–149.0)
VLDL: 29.4 mg/dL (ref 0.0–40.0)

## 2024-09-15 LAB — CBC
HCT: 45.1 % (ref 39.0–52.0)
Hemoglobin: 15.3 g/dL (ref 13.0–17.0)
MCHC: 34 g/dL (ref 30.0–36.0)
MCV: 86.2 fl (ref 78.0–100.0)
Platelets: 253 K/uL (ref 150.0–400.0)
RBC: 5.23 Mil/uL (ref 4.22–5.81)
RDW: 13.3 % (ref 11.5–15.5)
WBC: 8.9 K/uL (ref 4.0–10.5)

## 2024-09-15 LAB — TSH: TSH: 1.44 u[IU]/mL (ref 0.35–5.50)

## 2024-09-15 NOTE — Addendum Note (Signed)
 Addended by: CLAUDENE TOBIAS PARAS on: 09/15/2024 04:43 PM   Modules accepted: Orders

## 2024-09-15 NOTE — Assessment & Plan Note (Signed)
Chronic Lipid panel CMP, TSH Healthy diet encouraged Continue regular exercise Encouraged weight loss

## 2024-09-15 NOTE — Assessment & Plan Note (Signed)
 Chronic Lab Results  Component Value Date   HGBA1C 5.5 01/22/2023   Check a1c Low sugar / carb diet Stressed regular exercise

## 2024-09-18 ENCOUNTER — Encounter (INDEPENDENT_AMBULATORY_CARE_PROVIDER_SITE_OTHER): Payer: Self-pay
